# Patient Record
Sex: Female | Born: 1975 | Race: Black or African American | Hispanic: No | Marital: Married | Smoking: Never smoker
Health system: Southern US, Community
[De-identification: ages and names within clinical notes are randomized; demographics above are authoritative.]

## PROBLEM LIST (undated history)

## (undated) DIAGNOSIS — K219 Gastro-esophageal reflux disease without esophagitis: Secondary | ICD-10-CM

## (undated) DIAGNOSIS — F419 Anxiety disorder, unspecified: Secondary | ICD-10-CM

## (undated) HISTORY — PX: WISDOM TOOTH EXTRACTION: SHX21

---

## 2001-10-19 ENCOUNTER — Emergency Department (HOSPITAL_COMMUNITY): Admission: EM | Admit: 2001-10-19 | Discharge: 2001-10-19 | Payer: Self-pay | Admitting: Emergency Medicine

## 2001-10-21 ENCOUNTER — Emergency Department (HOSPITAL_COMMUNITY): Admission: EM | Admit: 2001-10-21 | Discharge: 2001-10-21 | Payer: Self-pay

## 2001-11-25 ENCOUNTER — Emergency Department (HOSPITAL_COMMUNITY): Admission: EM | Admit: 2001-11-25 | Discharge: 2001-11-25 | Payer: Self-pay | Admitting: Emergency Medicine

## 2002-07-24 ENCOUNTER — Emergency Department (HOSPITAL_COMMUNITY): Admission: EM | Admit: 2002-07-24 | Discharge: 2002-07-24 | Payer: Self-pay | Admitting: Emergency Medicine

## 2002-09-25 ENCOUNTER — Emergency Department (HOSPITAL_COMMUNITY): Admission: EM | Admit: 2002-09-25 | Discharge: 2002-09-25 | Payer: Self-pay | Admitting: Emergency Medicine

## 2005-05-10 HISTORY — PX: CHOLECYSTECTOMY: SHX55

## 2011-09-14 ENCOUNTER — Encounter (HOSPITAL_COMMUNITY): Payer: Self-pay

## 2011-09-14 ENCOUNTER — Emergency Department (INDEPENDENT_AMBULATORY_CARE_PROVIDER_SITE_OTHER)
Admission: EM | Admit: 2011-09-14 | Discharge: 2011-09-14 | Disposition: A | Discharge status: Home / Self Care | Payer: Self-pay | Source: Home / Self Care | Attending: Family Medicine | Admitting: Family Medicine

## 2011-09-14 DIAGNOSIS — M545 Low back pain, unspecified: Secondary | ICD-10-CM

## 2011-09-14 MED ORDER — CYCLOBENZAPRINE HCL 10 MG PO TABS: 10.0000 mg | ORAL_TABLET | Freq: Three times a day (TID) | ORAL | Status: AC | PRN

## 2011-09-14 MED ORDER — IBUPROFEN 800 MG PO TABS: 800.0000 mg | ORAL_TABLET | Freq: Three times a day (TID) | ORAL | Status: AC | PRN

## 2011-09-14 NOTE — Discharge Instructions (Signed)
Read the information below.  Use ice/cold compresses, the prescribed pain medication, gentle stretching and massage for pain.  If you need additional pain medication, you may take over the counter tylenol.  If you develop weakness or numbness in your legs, loss of control or bowel or bladder, or are unable to walk, go directly to the nearest emergency room.  You may return to the urgent care at any time for worsening condition or any new symptoms that concern you.  Back Pain, Adult Low back pain is very common. About 1 in 5 people have back pain.The cause of low back pain is rarely dangerous. The pain often gets better over time.About half of people with a sudden onset of back pain feel better in just 2 weeks. About 8 in 10 people feel better by 6 weeks.  CAUSES Some common causes of back pain include:  Strain of the muscles or ligaments supporting the spine.   Wear and tear (degeneration) of the spinal discs.   Arthritis.   Direct injury to the back.  DIAGNOSIS Most of the time, the direct cause of low back pain is not known.However, back pain can be treated effectively even when the exact cause of the pain is unknown.Answering your caregiver's questions about your overall health and symptoms is one of the most accurate ways to make sure the cause of your pain is not dangerous. If your caregiver needs more information, he or she may order lab work or imaging tests (X-rays or MRIs).However, even if imaging tests show changes in your back, this usually does not require surgery. HOME CARE INSTRUCTIONS For many people, back pain returns.Since low back pain is rarely dangerous, it is often a condition that people can learn to [facility] their own.   Remain active. It is stressful on the back to sit or stand in one place. Do not sit, drive, or stand in one place for more than 30 minutes at a time. Take short walks on level surfaces as soon as pain allows.Try to increase the length of time you  walk each day.   Do not stay in bed.Resting more than 1 or 2 days can delay your recovery.   Do not avoid exercise or work.Your body is made to move.It is not dangerous to be active, even though your back may hurt.Your back will likely heal faster if you return to being active before your pain is gone.   Pay attention to your body when you bend and lift. Many people have less discomfortwhen lifting if they bend their knees, keep the load close to their bodies,and avoid twisting. Often, the most comfortable positions are those that put less stress on your recovering back.   Find a comfortable position to sleep. Use a firm mattress and lie on your side with your knees slightly bent. If you lie on your back, put a pillow under your knees.   Only take over-the-counter or prescription medicines as directed by your caregiver. Over-the-counter medicines to reduce pain and inflammation are often the most helpful.Your caregiver may prescribe muscle relaxant drugs.These medicines help dull your pain so you can more quickly return to your normal activities and healthy exercise.   Put ice on the injured area.   Put ice in a plastic bag.   Place a towel between your skin and the bag.   Leave the ice on for 15 to 20 minutes, 3 to 4 times a day for the first 2 to 3 days. After that, ice and  heat may be alternated to reduce pain and spasms.   Ask your caregiver about trying back exercises and gentle massage. This may be of some benefit.   Avoid feeling anxious or stressed.Stress increases muscle tension and can worsen back pain.It is important to recognize when you are anxious or stressed and learn ways to manage it.Exercise is a great option.  SEEK MEDICAL CARE IF:  You have pain that is not relieved with rest or medicine.   You have pain that does not improve in 1 week.   You have new symptoms.   You are generally not feeling well.  SEEK IMMEDIATE MEDICAL CARE IF:   You have pain that  radiates from your back into your legs.   You develop new bowel or bladder control problems.   You have unusual weakness or numbness in your arms or legs.   You develop nausea or vomiting.   You develop abdominal pain.   You feel faint.  Document Released: 04/26/2005 Document Revised: 04/15/2011 Document Reviewed: 09/14/2010 [facility] Patient Information 2012 [place], [place].  Back Exercises Back exercises help treat and prevent back injuries. The goal of back exercises is to increase the strength of your abdominal and back muscles and the flexibility of your back. These exercises should be started when you no longer have back pain. Back exercises include:  Pelvic Tilt. Lie on your back with your knees bent. Tilt your pelvis until the lower part of your back is against the floor. Hold this position 5 to 10 sec and repeat 5 to 10 times.   Knee to Chest. Pull first 1 knee up against your chest and hold for 20 to 30 seconds, repeat this with the other knee, and then both knees. This may be done with the other leg straight or bent, whichever feels better.   Sit-Ups or Curl-Ups. Bend your knees 90 degrees. Start with tilting your pelvis, and do a partial, slow sit-up, lifting your trunk only 30 to 45 degrees off the floor. Take at least 2 to 3 seconds for each sit-up. Do not do sit-ups with your knees out straight. If partial sit-ups are difficult, simply do the above but with only tightening your abdominal muscles and holding it as directed.   Hip-Lift. Lie on your back with your knees flexed 90 degrees. Push down with your feet and shoulders as you raise your hips a couple inches off the floor; hold for 10 seconds, repeat 5 to 10 times.   Back arches. Lie on your stomach, propping yourself up on bent elbows. Slowly press on your hands, causing an arch in your low back. Repeat 3 to 5 times. Any initial stiffness and discomfort should lessen with repetition over time.   Shoulder-Lifts. Lie face  down with arms beside your body. Keep hips and torso pressed to floor as you slowly lift your head and shoulders off the floor.  Do not overdo your exercises, especially in the beginning. Exercises may cause you some mild back discomfort which lasts for a few minutes; however, if the pain is more severe, or lasts for more than 15 minutes, do not continue exercises until you see your caregiver. Improvement with exercise therapy for back problems is slow.  See your caregivers for assistance with developing a proper back exercise program. Document Released: 06/03/2004 Document Revised: 04/15/2011 Document Reviewed: 04/26/2005 [facility] Patient Information 2012 [place], [place].  If you have no primary doctor, here are some resources that may be helpful:  Medicaid-accepting [facility] Providers:   -  [facility]- [address]      [phone]      Mon-Fri 9am-7pm, Sat 9am-1pm   - [facility]- [address] [place]      [phone]   - [facility]- [address] [place]      [phone]   [facility]- [address]      [phone]   - Renaye Rakers- [address] [place], [address]      [phone]      Only accepts [place] patients       after they have her name applied to their card   Self Pay (no insurance) in [place]:   - Sickle Cell Patients: Dr Willey Blade, [facility]      [address] [place]      [phone]   - [facility] [phone]   - Physician Referral Service- [phone]   - [facility]- [address] [place]      [phone]   [facility]- [address]   - [facility]- see information above      (Speak to [employer] if you do not have insurance)   - [facility]- [address] [place]      [phone]   - [facility] [place]- [address]      [phone]   - [facility]-  [address]      [phone]   - Dr Julio Sicks-  [address], [place]      [phone]   - [facility]- [address]      [phone]   - [facility]- [address]      [phone]      Also [address]      [phone]   - [facility]- [address]      [phone]      1st and 3rd Saturday every month, 10am-1pm Other agencies that provide inexpensive medical care:    [facility]  [phone]    [facility]  [phone]    [facility]  [phone]    [facility]  [phone]    [facility]  [phone]  General Information: Finding a doctor when you do not have health insurance can be tricky. Although you are not limited by an insurance plan, you are of course limited by her finances and how much but he can pay out of pocket.  What are your options if you don't have health insurance?   1) Find a Librarian, academic and Pay Out of Pocket Although you won't have to find out who is covered by your insurance plan, it is a good idea to ask around and get recommendations. You will then need to call the office and see if the doctor you have chosen will accept you as a new patient and what types of options they offer for patients who are self-pay. Some doctors offer discounts or will set up payment plans for their patients who do not have insurance, but you will need to ask so you aren't surprised when you get to your appointment.  2) Contact Your Local Health Department Not all health departments have doctors that can see patients for sick visits, but many  do, so it is worth a call to see if yours does. If you don't know where your local health department is, you can check in your phone book. The CDC also has a tool to help you locate your state's health department, and many state websites also have listings of all of their local health departments.  3) Find a Walk-in Clinic If your illness is not  likely to be very severe or complicated, you may want to try a walk in clinic. These are popping up all over the country in pharmacies, drugstores, and shopping centers. They're usually staffed by nurse practitioners or physician assistants that have been trained to treat common illnesses and complaints. They're usually fairly quick and inexpensive. However, if you have serious medical issues or chronic medical problems, these are probably not your best option  RESOURCE GUIDE  Dental Problems  Patients with Medicaid: [facility] [phone] [address]                                           [phone] [employer] [address] Phone:  [phone]                                                  Phone:  [phone]  If unable to pay or uninsured, contact:  [facility] or [facility]. to become qualified for the adult dental clinic.  Chronic Pain Problems Contact [facility]  [phone] Patients need to be referred by their primary care doctor.  Insufficient Money for Medicine Contact [employer] Way:  call "211" or [facility] [phone].  No Primary Care Doctor Call [facility]  [phone] Other agencies that provide inexpensive medical care    [facility]  [phone]    [facility]  [phone]    [facility]  [phone]    [facility]  [phone]    [facility]  [phone]    [facility]  [phone]  Psychological Services [facility] Behavior[facility] Health  [phone] [facility] Services  [phone] [facility] Mental Health   [phone] (emergency services [phone])  Substance Abuse Resources [facility] and Drug Services  [phone] [facility] [phone] [facility] [place] [phone] Floydene Flock [phone] Residential & Outpatient Substance Abuse Program  563-676-0017  Abuse/Neglect [facility] Child Abuse Hotline 6265985733 [facility] Child Abuse Hotline 218-124-7232 (After Hours)  Emergency Shelter [facility] [phone]  Maternity Homes Room at the [place] [facility] [place] [phone] [facility] [phone]  MRSA Hotline #:   587-793-2081    [facility] Resources  [facility]     [employer]                          [facility] Dept. [address] [place]                       [address]      [address]  [address]  Sela Hua Phone:  [phone]                                   Phone:  [phone]                 Phone:  [place] Phone:  [place] [phone] (After Hours)

## 2011-09-14 NOTE — ED Provider Notes (Signed)
History     CSN: [id number]  Arrival date & time 09/14/11  4696   First MD Initiated Contact with Patient 09/14/11 [phone]      Chief Complaint  Patient presents with  . Back Pain    (Consider location/radiation/quality/duration/timing/severity/associated sxs/prior treatment) HPI Comments: Patient reports she was standing on something trying to pull a suitcase out of her closet when she slipped an fell against the door frame, injuring her back.  Reports pain in her left lower back, aching in nature, constant, worse with moving, twisting, or bending, or with palpation.  Pain does not radiate.  Denies fevers, bladder or bowel incontinence or retention, weakness or numbness of the legs, abdominal pain, urinary, vaginal or bowel symptoms.  Pt is currently menstruating, period is on time and normal.    Patient is a 36 y.o. female presenting with back pain. The history is provided by the patient.  Back Pain  Pertinent negatives include no fever, no numbness, no abdominal pain, no dysuria and no weakness.    History reviewed. No pertinent past medical history.  History reviewed. No pertinent past surgical history.  No family history on file.  History  Substance Use Topics  . Smoking status: Never Smoker   . Smokeless tobacco: Not on file  . Alcohol Use: Yes    OB History    Grav Para Term Preterm Abortions TAB SAB Ect Mult Living                  Review of Systems  Constitutional: Negative for fever and chills.  Gastrointestinal: Negative for vomiting, abdominal pain, diarrhea and constipation.  Genitourinary: Negative for dysuria, urgency, frequency and menstrual problem.  Musculoskeletal: Positive for back pain.  Neurological: Negative for weakness and numbness.    Allergies  Review of patient's allergies indicates no known allergies.  Home Medications  No current outpatient prescriptions on file.  BP 137/67  Pulse 78  Temp(Src) 98.1 F (36.7 C) (Oral)  Resp 18   SpO2 100%  LMP 09/10/2011  Physical Exam  Nursing note and vitals reviewed. Constitutional: She is oriented to person, place, and time. She appears well-developed and well-nourished.  HENT:  Head: Normocephalic and atraumatic.  Neck: Normal range of motion. Neck supple.  Cardiovascular: Normal rate and regular rhythm.   Pulmonary/Chest: Effort normal and breath sounds normal.  Abdominal: Soft. She exhibits no distension and no mass. There is no tenderness. There is no rebound and no guarding.  Musculoskeletal: Normal range of motion. She exhibits no edema.       Cervical back: Normal.       Thoracic back: Normal.       Lumbar back: Normal.       Arms:      Lower extremities: strength 5/5, sensation intact, distal pulses intact.  Straight leg raise negative.    Neurological: She is alert and oriented to person, place, and time. She has normal strength. No sensory deficit.    ED Course  Procedures (including critical care time)  Labs Reviewed - No data to display No results found.   1. Low back pain       MDM  Nontoxic afebrile patient with left sided low back pain after slipping off stool while reaching for something in a closet. She did not fall to ground or hit her head.  Pain is within soft tissues, likely muscle strain.  No bony tenderness or neurological deficits.  Pt d/c home with flexeril and ibuprofen, return precautions given.  Patient verbalizes understanding and agrees with plan.          Rise Patience, [place] 09/14/11 1252

## 2011-09-14 NOTE — ED Notes (Signed)
Pt states she injured her back trying to remove a suitcase from the top of her closet yesterday.  C/o pain in lt lower back.

## 2011-09-15 NOTE — ED Provider Notes (Signed)
Medical screening examination/treatment/procedure(s) were performed by resident physician or non-physician practitioner and as supervising physician I was immediately available for consultation/collaboration.   Lailana Shira DOUGLAS MD.    Ilario Dhaliwal D Daisha Filosa, MD 09/15/11 2058 

## 2013-01-15 ENCOUNTER — Encounter (HOSPITAL_COMMUNITY): Payer: Self-pay | Admitting: Emergency Medicine

## 2013-01-15 ENCOUNTER — Emergency Department (INDEPENDENT_AMBULATORY_CARE_PROVIDER_SITE_OTHER)
Admission: EM | Admit: 2013-01-15 | Discharge: 2013-01-15 | Disposition: A | Discharge status: Home / Self Care | Payer: PRIVATE HEALTH INSURANCE | Source: Home / Self Care | Attending: Family Medicine | Admitting: Family Medicine

## 2013-01-15 LAB — POCT PREGNANCY, URINE: Preg Test, Ur: NEGATIVE

## 2013-01-15 MED ORDER — METHOCARBAMOL 500 MG PO TABS: 500.0000 mg | ORAL_TABLET | Freq: Three times a day (TID) | ORAL | Status: DC
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
## 2014-07-16 ENCOUNTER — Telehealth: Payer: Self-pay

## 2014-07-16 DIAGNOSIS — Z029 Encounter for administrative examinations, unspecified: Secondary | ICD-10-CM

## 2014-07-16 NOTE — Telephone Encounter (Signed)
fmla papers ready to pick up 15.00 charge

## 2014-10-01 LAB — HM PAP SMEAR: HM Pap smear: NORMAL

## 2014-11-05 LAB — HM MAMMOGRAPHY: HM Mammogram: NORMAL

## 2015-04-09 ENCOUNTER — Emergency Department (HOSPITAL_COMMUNITY)
Admission: EM | Admit: 2015-04-09 | Discharge: 2015-04-09 | Disposition: A | Discharge status: Home / Self Care | Payer: BC Managed Care – PPO | Source: Home / Self Care

## 2015-04-09 ENCOUNTER — Encounter (HOSPITAL_COMMUNITY): Payer: Self-pay | Admitting: Emergency Medicine

## 2015-04-09 DIAGNOSIS — R1011 Right upper quadrant pain: Secondary | ICD-10-CM

## 2015-04-09 HISTORY — DX: Anxiety disorder, unspecified: F41.9

## 2015-04-09 LAB — POCT URINALYSIS DIP (DEVICE)
Glucose, UA: 100 mg/dL — AB
Hgb urine dipstick: NEGATIVE
Leukocytes, UA: NEGATIVE
Nitrite: NEGATIVE
Protein, ur: 30 mg/dL — AB
Specific Gravity, Urine: >=1.03 (ref 1.005–1.030)
Urobilinogen, UA: 4 mg/dL — ABNORMAL HIGH (ref 0.0–1.0)
pH: 6 (ref 5.0–8.0)

## 2015-04-09 NOTE — ED Provider Notes (Signed)
CSN: [id number]     Arrival date & time 04/09/15  1302 History   None    Chief Complaint  Patient presents with  . Back Pain   (Consider location/radiation/quality/duration/timing/severity/associated sxs/prior Treatment) HPI  abdomen Several days pain has been present, episodic. No treatment at home. No nausea, vomiting. Sharp on occasion, radiates at times to back.  Past Medical History  Diagnosis Date  . Anxiety    Past Surgical History  Procedure Laterality Date  . Cholecystectomy     History reviewed. No pertinent family history. Social History  Substance Use Topics  . Smoking status: Never Smoker   . Smokeless tobacco: None  . Alcohol Use: Yes   OB History    No data available     Review of Systems ROS +'ve  ABDOMINAL PAIN  Denies: HEADACHE, NAUSEA,, CHEST PAIN, CONGESTION, DYSURIA, SHORTNESS OF BREATH   Allergies  Review of patient's allergies indicates no known allergies.  Home Medications   Prior to Admission medications   Medication Sig Start Date End Date Taking? Authorizing Provider  OVER THE COUNTER MEDICATION Prescribed  Anti anxiety medicine-cannot remember the name   Yes Historical Provider, MD  OVER THE COUNTER MEDICATION Over the counter diet pills started a month ago and stopped about a week ago, (after onset of right side pain)   Yes Historical Provider, MD  methocarbamol (ROBAXIN) 500 MG tablet Take 1 tablet (500 mg total) by mouth 3 (three) times daily. As muscle relaxer Patient not taking: Reported on 04/09/2015 01/15/13   Linna Hoff, MD   Meds Ordered and Administered this Visit  Medications - No data to display  BP 127/80 mmHg  Pulse 83  Temp(Src) 97.8 F (36.6 C) (Oral)  Resp 18  SpO2 99%  LMP 03/17/2015 No data found.   Physical Exam  Constitutional: She is oriented to person, place, and time. She appears well-developed and well-nourished.  HENT:  Head: Normocephalic and atraumatic.  Mouth/Throat: Oropharynx is clear and  moist.  Eyes: Conjunctivae are normal.  Neck: Normal range of motion. Neck supple.  Cardiovascular: Normal rate.   Pulmonary/Chest: Effort normal and breath sounds normal.  Abdominal: Soft. Bowel sounds are normal. She exhibits no distension. There is no tenderness. There is no rebound and no guarding.  Musculoskeletal: Normal range of motion.  Neurological: She is alert and oriented to person, place, and time.  Skin: Skin is warm and dry.  Psychiatric: She has a normal mood and affect. Her behavior is normal. Judgment and thought content normal.  Nursing note and vitals reviewed.   ED Course  Procedures (including critical care time)  Labs Review Labs Reviewed  POCT URINALYSIS DIP (DEVICE) - Abnormal; Notable for the following:    Glucose, UA 100 (*)    Bilirubin Urine LARGE (*)    Ketones, ur TRACE (*)    Protein, ur 30 (*)    Urobilinogen, UA 4.0 (*)    All other components within normal limits    Imaging Review No results found.   Visual Acuity Review  Right Eye Distance:   Left Eye Distance:   Bilateral Distance:    Right Eye Near:   Left Eye Near:    Bilateral Near:         MDM   1. Right upper quadrant pain    I have discussed urine findings with patient and the need for her to follow up with a PCP to further investigate the changes in her urine. She has had a  cholecystectomy in 2007.    Tharon AquasFrank C Lira Stephen, PA 04/09/15 2045

## 2015-04-09 NOTE — Discharge Instructions (Signed)
Results for Hardie PulleyFISHER, Carmellia (MRN [id number]) as of 04/09/2015 14:55  Ref. Range 04/09/2015 14:16  Bilirubin Urine Latest Ref Range: NEGATIVE  LARGE (A)  Glucose Latest Ref Range: NEGATIVE mg/dL 409100 (A)  Hgb urine dipstick Latest Ref Range: NEGATIVE  NEGATIVE  Ketones, ur Latest Ref Range: NEGATIVE mg/dL TRACE (A)  Leukocytes, UA Latest Ref Range: NEGATIVE  NEGATIVE  Nitrite Latest Ref Range: NEGATIVE  NEGATIVE  pH Latest Ref Range: 5.0-8.0  6.0  Protein Latest Ref Range: NEGATIVE mg/dL 30 (A)  Specific Gravity, Urine Latest Ref Range: 1.005-1.030  >=1.030  Urobilinogen, UA Latest Ref Range: 0.0-1.0 mg/dL 4.0 (H)    You will need to follow up with PCP in the near future to have your urine test evaluated more thoroughly.  There are no untoward findings that require emergent follow up at this time.  Eat your normal diet, limit fatty foods, drink plenty of water

## 2015-04-21 ENCOUNTER — Ambulatory Visit: Payer: Self-pay | Admitting: Internal Medicine

## 2015-04-24 ENCOUNTER — Ambulatory Visit (INDEPENDENT_AMBULATORY_CARE_PROVIDER_SITE_OTHER): Payer: BC Managed Care – PPO | Admitting: Internal Medicine

## 2015-04-24 ENCOUNTER — Encounter (INDEPENDENT_AMBULATORY_CARE_PROVIDER_SITE_OTHER): Payer: Self-pay

## 2015-04-24 ENCOUNTER — Encounter: Payer: Self-pay | Admitting: Internal Medicine

## 2015-04-24 VITALS — BP 138/80 | HR 69 | Temp 98.0°F | Ht 62.0 in | Wt 226.0 lb

## 2015-04-24 DIAGNOSIS — R1011 Right upper quadrant pain: Secondary | ICD-10-CM | POA: Diagnosis not present

## 2015-04-24 DIAGNOSIS — F411 Generalized anxiety disorder: Secondary | ICD-10-CM | POA: Insufficient documentation

## 2015-04-24 NOTE — Patient Instructions (Signed)

## 2015-04-24 NOTE — Progress Notes (Signed)
HPI  Pt presents to the clinic today to establish care and for management of the conditions listed below. She has not had a PCP in many years but she has been following with GYN.  Anxiety: She is prescribed Celexa but reports she doesn't take it. She does not feel like she needs it right now. When she did take it, she recalls that it made her sleepy.  She also reports RUQ pain. This started 2 weeks ago. The pain starts after eating fatty meals. ometimes it is associated with nausea but no vomiting. Her bowels are moving normally. She did try Mylanta but reports that made her vomit. She did have her gallbladder removed in 2007. She denies urinary or vaginal complaints.  Flu: 02/2014, declines today Tetanus 2014 Pap Smear: 11/2014 Mammogram: 10/2014, family history Dentist: as needed  Past Medical History  Diagnosis Date  . Anxiety     Current Outpatient Prescriptions  Medication Sig Dispense Refill  . citalopram (CELEXA) 10 MG tablet Take 10 mg by mouth daily. Reported on 04/24/2015     No current facility-administered medications for this visit.    No Known Allergies  Family History  Problem Relation Age of Onset  . Hypertension Mother   . Arthritis Maternal Grandmother   . Breast cancer Maternal Grandmother     Social History   Social History  . Marital Status: Married    Spouse Name: N/A  . Number of Children: N/A  . Years of Education: N/A   Occupational History  . Not on file.   Social History Main Topics  . Smoking status: Never Smoker   . Smokeless tobacco: Never Used  . Alcohol Use: 0.0 oz/week    0 Standard drinks or equivalent per week     Comment: occasional  . Drug Use: No  . Sexual Activity: Yes    Birth Control/ Protection: None   Other Topics Concern  . Not on file   Social History Narrative    ROS:  Constitutional: Denies fever, malaise, fatigue, headache or abrupt weight changes.  Respiratory: Denies difficulty breathing, shortness of  breath, cough or sputum production.   Cardiovascular: Denies chest pain, chest tightness, palpitations or swelling in the hands or feet.  Gastrointestinal: Pt reports RUQ pain and nausea. Denies bloating, constipation, diarrhea or blood in the stool.  GU: Denies frequency, urgency, pain with urination, blood in urine, odor or discharge. Musculoskeletal: Denies decrease in range of motion, difficulty with gait, muscle pain or joint pain and swelling.  Skin: Denies redness, rashes, lesions or ulcercations.  Neurological: Denies dizziness, difficulty with memory, difficulty with speech or problems with balance and coordination.  Psych: Denies anxiety, depression, SI/HI.  No other specific complaints in a complete review of systems (except as listed in HPI above).  PE:  BP 138/80 mmHg  Pulse 69  Temp(Src) 98 F (36.7 C) (Oral)  Ht  (1.575 m)  Wt 226 lb (102.513 kg)  BMI 41.33 kg/m2  SpO2 98%  LMP 04/13/2015 Wt Readings from Last 3 Encounters:  04/24/15 226 lb (102.513 kg)    General: Appears her stated age, obese in NAD. Cardiovascular: Normal rate and rhythm. S1,S2 noted.  No murmur, rubs or gallops noted. Pulmonary/Chest: Normal effort and positive vesicular breath sounds. No respiratory distress. No wheezes, rales or ronchi noted.  Abdomen: Soft and nontender. Normal bowel sounds. Neurological: Alert and oriented. Psychiatric: Mood and affect normal. Behavior is normal. Judgment and thought content normal.    Assessment and Plan:  RUQ pain:  S/p cholecystectomy CMET today Try Tums prn to see if that helps  Make an appt for your annual exam

## 2015-04-24 NOTE — Assessment & Plan Note (Signed)
Stable at this time off meds Will continue to monitor

## 2015-04-25 LAB — COMPREHENSIVE METABOLIC PANEL
ALT: 31 U/L (ref 0–35)
AST: 26 U/L (ref 0–37)
Albumin: 4.2 g/dL (ref 3.5–5.2)
Alkaline Phosphatase: 105 U/L (ref 39–117)
BUN: 11 mg/dL (ref 6–23)
CO2: 29 mEq/L (ref 19–32)
Calcium: 9.9 mg/dL (ref 8.4–10.5)
Chloride: 100 mEq/L (ref 96–112)
Creatinine, Ser: 0.75 mg/dL (ref 0.40–1.20)
GFR: 111.03 mL/min (ref >60.00)
Glucose, Bld: 85 mg/dL (ref 70–99)
Potassium: 3.9 mEq/L (ref 3.5–5.1)
Sodium: 137 mEq/L (ref 135–145)
Total Bilirubin: 0.7 mg/dL (ref 0.2–1.2)
Total Protein: 7.7 g/dL (ref 6.0–8.3)

## 2015-05-07 ENCOUNTER — Encounter: Payer: Self-pay | Admitting: Internal Medicine

## 2015-05-16 ENCOUNTER — Encounter: Payer: Self-pay | Admitting: Primary Care

## 2015-05-16 ENCOUNTER — Ambulatory Visit (INDEPENDENT_AMBULATORY_CARE_PROVIDER_SITE_OTHER): Payer: BC Managed Care – PPO | Admitting: Primary Care

## 2015-05-16 VITALS — BP 126/74 | HR 93 | Temp 98.0°F | Ht 62.0 in | Wt 219.8 lb

## 2015-05-16 DIAGNOSIS — R1011 Right upper quadrant pain: Secondary | ICD-10-CM | POA: Diagnosis not present

## 2015-05-16 NOTE — Progress Notes (Signed)
Pre visit review using our clinic review tool, if applicable. No additional management support is needed unless otherwise documented below in the visit note. 

## 2015-05-16 NOTE — Progress Notes (Signed)
   Subjective:    Patient ID: Brianna Schneider, female    DOB: 08-04-1975, 40 y.o.   MRN: [id number]  HPI  Ms. Brianna Schneider is a 40 year old female who presents today with a chief complaint of  abdominal pain. Her pain is located to the RUQ, epigastric, and right flank region. Her pain has been present intermittently for the past several months. Her last episode was Wednesday night this week. She describes her pain as dull, achy, and is tender upon palpation. Her gallbladder was removed in 2007. She's had 2 episodes of nausea with vomiting Wednesday. She also reports frequent belching.  She was evaluated several weeks ago regarding the same. She's taken advil and Tums with temporary improvement. She does experience some burning to her esophagus. Denies fevers, diarrhea. Her bowel movements are once daily on average, last BM was today. She She's been able to eat and drink bland foods today.   Review of Systems  Constitutional: Negative for fever and chills.  Gastrointestinal: Positive for nausea, vomiting and abdominal pain. Negative for diarrhea and constipation.       Past Medical History  Diagnosis Date  . Anxiety     Social History   Social History  . Marital Status: Married    Spouse Name: N/A  . Number of Children: N/A  . Years of Education: N/A   Occupational History  . Not on file.   Social History Main Topics  . Smoking status: Never Smoker   . Smokeless tobacco: Never Used  . Alcohol Use: 0.0 oz/week    0 Standard drinks or equivalent per week     Comment: occasional  . Drug Use: No  . Sexual Activity: Yes    Birth Control/ Protection: None   Other Topics Concern  . Not on file   Social History Narrative    Past Surgical History  Procedure Laterality Date  . Cholecystectomy  2007  . Wisdom tooth extraction      Family History  Problem Relation Age of Onset  . Hypertension Mother   . Arthritis Maternal Grandmother   . Breast cancer Maternal Grandmother      No Known Allergies  Current Outpatient Prescriptions on File Prior to Visit  Medication Sig Dispense Refill  . citalopram (CELEXA) 10 MG tablet Take 10 mg by mouth daily. Reported on 05/16/2015     No current facility-administered medications on file prior to visit.    BP 126/74 mmHg  Pulse 93  Temp(Src) 98 F (36.7 C) (Oral)  Ht 5\' 2"  (1.575 m)  Wt 219 lb 12.8 oz (99.701 kg)  BMI 40.19 kg/m2  SpO2 99%  LMP 04/16/2015    Objective:   Physical Exam  Constitutional: She appears well-nourished.  Cardiovascular: Normal rate and regular rhythm.   Pulmonary/Chest: Effort normal and breath sounds normal.  Abdominal: Soft. Normal appearance and bowel sounds are normal. There is tenderness in the right upper quadrant.  Skin: Skin is warm and dry.          Assessment & Plan:  Abdominal pain:  Located to RUQ, epigastric region, radiates to right flank. Burning, sharp pain with belching. Some n/v Wednesday, none since. Tolerating oral intake today. Suspect GERD and will do 4 week trial of omeprazole. Offered US of RUQ, patient would like to try omeprazole first. CMP reviewed from last visit and is WNL. She is to notify us if no improvement on omeprazole or if pain persists. Return precautions provided.

## 2015-05-16 NOTE — Patient Instructions (Signed)
Start omeprazole (Prilosec) tablets for acid reflux. Take 1 tablet by mouth everyday.  Work to reduce fried/fatty foods. Take a look at the triggers of acid reflux below.  Please call us if you continue to experience pain as we may need to do an ultrasound of your abdomen.   It was a pleasure meeting you!  Food Choices for Gastroesophageal Reflux Disease, Adult When you have gastroesophageal reflux disease (GERD), the foods you eat and your eating habits are very important. Choosing the right foods can help ease the discomfort of GERD. WHAT GENERAL GUIDELINES DO I NEED TO FOLLOW?  Choose fruits, vegetables, whole grains, low-fat dairy products, and low-fat meat, fish, and poultry.  Limit fats such as oils, salad dressings, butter, nuts, and avocado.  Keep a food diary to identify foods that cause symptoms.  Avoid foods that cause reflux. These may be different for different people.  Eat frequent small meals instead of three large meals each day.  Eat your meals slowly, in a relaxed setting.  Limit fried foods.  Cook foods using methods other than frying.  Avoid drinking alcohol.  Avoid drinking large amounts of liquids with your meals.  Avoid bending over or lying down until 2-3 hours after eating. WHAT FOODS ARE NOT RECOMMENDED? The following are some foods and drinks that may worsen your symptoms: Vegetables Tomatoes. Tomato juice. Tomato and spaghetti sauce. Chili peppers. Onion and garlic. Horseradish. Fruits Oranges, grapefruit, and lemon (fruit and juice). Meats High-fat meats, fish, and poultry. This includes hot dogs, ribs, ham, sausage, salami, and bacon. Dairy Whole milk and chocolate milk. Sour cream. Cream. Butter. Ice cream. Cream cheese.  Beverages Coffee and tea, with or without caffeine. Carbonated beverages or energy drinks. Condiments Hot sauce. Barbecue sauce.  Sweets/Desserts Chocolate and cocoa. Donuts. Peppermint and spearmint. Fats and  Oils High-fat foods, including [place] fries and potato chips. Other Vinegar. Strong spices, such as black pepper, white pepper, red pepper, cayenne, curry powder, cloves, ginger, and chili powder. The items listed above may not be a complete list of foods and beverages to avoid. Contact your dietitian for more information.   This information is not intended to replace advice given to you by your health care provider. Make sure you discuss any questions you have with your health care provider.   Document Released: 04/26/2005 Document Revised: 05/17/2014 Document Reviewed: 02/28/2013 Elsevier Interactive Patient Education [employer]2016 Elsevier Inc.

## 2015-05-19 ENCOUNTER — Ambulatory Visit: Payer: Self-pay | Admitting: Internal Medicine

## 2015-05-22 ENCOUNTER — Encounter: Payer: Self-pay | Admitting: Internal Medicine

## 2015-05-25 ENCOUNTER — Inpatient Hospital Stay (HOSPITAL_COMMUNITY)
Admission: EM | Admit: 2015-05-25 | Discharge: 2015-05-27 | DRG: 445 | Disposition: A | Discharge status: Home / Self Care | Payer: BC Managed Care – PPO | Attending: Internal Medicine | Admitting: Internal Medicine

## 2015-05-25 ENCOUNTER — Encounter (HOSPITAL_COMMUNITY): Payer: Self-pay | Admitting: *Deleted

## 2015-05-25 ENCOUNTER — Emergency Department (HOSPITAL_COMMUNITY): Payer: BC Managed Care – PPO

## 2015-05-25 DIAGNOSIS — Z803 Family history of malignant neoplasm of breast: Secondary | ICD-10-CM

## 2015-05-25 DIAGNOSIS — R1011 Right upper quadrant pain: Secondary | ICD-10-CM | POA: Diagnosis present

## 2015-05-25 DIAGNOSIS — R109 Unspecified abdominal pain: Secondary | ICD-10-CM | POA: Diagnosis present

## 2015-05-25 DIAGNOSIS — Z6841 Body Mass Index (BMI) 40.0 and over, adult: Secondary | ICD-10-CM | POA: Diagnosis not present

## 2015-05-25 DIAGNOSIS — R7401 Elevation of levels of liver transaminase levels: Secondary | ICD-10-CM | POA: Diagnosis present

## 2015-05-25 DIAGNOSIS — Z8249 Family history of ischemic heart disease and other diseases of the circulatory system: Secondary | ICD-10-CM | POA: Diagnosis not present

## 2015-05-25 DIAGNOSIS — K219 Gastro-esophageal reflux disease without esophagitis: Secondary | ICD-10-CM | POA: Insufficient documentation

## 2015-05-25 DIAGNOSIS — R7989 Other specified abnormal findings of blood chemistry: Secondary | ICD-10-CM | POA: Diagnosis present

## 2015-05-25 DIAGNOSIS — F419 Anxiety disorder, unspecified: Secondary | ICD-10-CM | POA: Diagnosis present

## 2015-05-25 DIAGNOSIS — R740 Nonspecific elevation of levels of transaminase and lactic acid dehydrogenase [LDH]: Secondary | ICD-10-CM

## 2015-05-25 DIAGNOSIS — K831 Obstruction of bile duct: Secondary | ICD-10-CM | POA: Diagnosis not present

## 2015-05-25 DIAGNOSIS — K8051 Calculus of bile duct without cholangitis or cholecystitis with obstruction: Secondary | ICD-10-CM | POA: Diagnosis present

## 2015-05-25 DIAGNOSIS — Q445 Other congenital malformations of bile ducts: Secondary | ICD-10-CM

## 2015-05-25 DIAGNOSIS — K838 Other specified diseases of biliary tract: Secondary | ICD-10-CM | POA: Diagnosis not present

## 2015-05-25 DIAGNOSIS — R945 Abnormal results of liver function studies: Secondary | ICD-10-CM

## 2015-05-25 DIAGNOSIS — R748 Abnormal levels of other serum enzymes: Secondary | ICD-10-CM

## 2015-05-25 DIAGNOSIS — Z9049 Acquired absence of other specified parts of digestive tract: Secondary | ICD-10-CM | POA: Diagnosis not present

## 2015-05-25 HISTORY — DX: Gastro-esophageal reflux disease without esophagitis: K21.9

## 2015-05-25 LAB — COMPREHENSIVE METABOLIC PANEL
ALT: 516 U/L — ABNORMAL HIGH (ref 14–54)
AST: 409 U/L — ABNORMAL HIGH (ref 15–41)
Albumin: 4.1 g/dL (ref 3.5–5.0)
Alkaline Phosphatase: 475 U/L — ABNORMAL HIGH (ref 38–126)
Anion gap: 12 (ref 5–15)
BUN: <5 mg/dL — ABNORMAL LOW (ref 6–20)
CO2: 25 mmol/L (ref 22–32)
Calcium: 9.8 mg/dL (ref 8.9–10.3)
Chloride: 98 mmol/L — ABNORMAL LOW (ref 101–111)
Creatinine, Ser: 0.69 mg/dL (ref 0.44–1.00)
GFR calc Af Amer: >60 mL/min (ref >60)
GFR calc non Af Amer: >60 mL/min (ref >60)
Glucose, Bld: 147 mg/dL — ABNORMAL HIGH (ref 65–99)
Potassium: 4 mmol/L (ref 3.5–5.1)
Sodium: 135 mmol/L (ref 135–145)
Total Bilirubin: 4.6 mg/dL — ABNORMAL HIGH (ref 0.3–1.2)
Total Protein: 8.1 g/dL (ref 6.5–8.1)

## 2015-05-25 LAB — URINALYSIS, ROUTINE W REFLEX MICROSCOPIC
Glucose, UA: NEGATIVE mg/dL
Hgb urine dipstick: NEGATIVE
Ketones, ur: 15 mg/dL — AB
Leukocytes, UA: NEGATIVE
Nitrite: NEGATIVE
Protein, ur: NEGATIVE mg/dL
Specific Gravity, Urine: 1.018 (ref 1.005–1.030)
pH: 6.5 (ref 5.0–8.0)

## 2015-05-25 LAB — LIPASE, BLOOD: Lipase: 17 U/L (ref 11–51)

## 2015-05-25 LAB — CBC
HCT: 41.5 % (ref 36.0–46.0)
Hemoglobin: 14.4 g/dL (ref 12.0–15.0)
MCH: 31.4 pg (ref 26.0–34.0)
MCHC: 34.7 g/dL (ref 30.0–36.0)
MCV: 90.4 fL (ref 78.0–100.0)
Platelets: 286 10*3/uL (ref 150–400)
RBC: 4.59 MIL/uL (ref 3.87–5.11)
RDW: 13.3 % (ref 11.5–15.5)
WBC: 16.8 10*3/uL — ABNORMAL HIGH (ref 4.0–10.5)

## 2015-05-25 LAB — I-STAT BETA HCG BLOOD, ED (MC, WL, AP ONLY): I-stat hCG, quantitative: <5 m[IU]/mL (ref <5)

## 2015-05-25 MED ORDER — ONDANSETRON HCL 4 MG/2ML IJ SOLN
4.0000 mg | Freq: Once | INTRAMUSCULAR | Status: AC
  Administered 2015-05-25: 4 mg via INTRAVENOUS
  Filled 2015-05-25: qty 2

## 2015-05-25 MED ORDER — SODIUM CHLORIDE 0.9 % IV SOLN
INTRAVENOUS | Status: DC
  Administered 2015-05-25 – 2015-05-26 (×2): via INTRAVENOUS

## 2015-05-25 MED ORDER — IOHEXOL 300 MG/ML  SOLN
100.0000 mL | Freq: Once | INTRAMUSCULAR | Status: AC | PRN
  Administered 2015-05-25: 100 mL via INTRAVENOUS

## 2015-05-25 MED ORDER — SODIUM CHLORIDE 0.9 % IV SOLN
INTRAVENOUS | Status: DC
  Administered 2015-05-26: 06:00:00 via INTRAVENOUS

## 2015-05-25 MED ORDER — SENNOSIDES-DOCUSATE SODIUM 8.6-50 MG PO TABS
1.0000 | ORAL_TABLET | Freq: Every evening | ORAL | Status: DC | PRN
  Administered 2015-05-26: 1 via ORAL
  Filled 2015-05-25: qty 1

## 2015-05-25 MED ORDER — BISACODYL 10 MG RE SUPP: 10.0000 mg | Freq: Every day | RECTAL | Status: DC | PRN

## 2015-05-25 MED ORDER — HYDROMORPHONE HCL 1 MG/ML IJ SOLN
1.0000 mg | Freq: Once | INTRAMUSCULAR | Status: AC
  Administered 2015-05-25: 1 mg via INTRAVENOUS
  Filled 2015-05-25: qty 1

## 2015-05-25 MED ORDER — CIPROFLOXACIN IN D5W 400 MG/200ML IV SOLN
400.0000 mg | INTRAVENOUS | Status: DC
  Administered 2015-05-25 – 2015-05-26 (×2): 400 mg via INTRAVENOUS
  Filled 2015-05-25 (×3): qty 200

## 2015-05-25 MED ORDER — HEPARIN SODIUM (PORCINE) 5000 UNIT/ML IJ SOLN
5000.0000 [IU] | Freq: Three times a day (TID) | INTRAMUSCULAR | Status: DC
  Administered 2015-05-25 – 2015-05-27 (×4): 5000 [IU] via SUBCUTANEOUS
  Filled 2015-05-25 (×4): qty 1

## 2015-05-25 MED ORDER — ONDANSETRON HCL 4 MG PO TABS: 4.0000 mg | ORAL_TABLET | Freq: Four times a day (QID) | ORAL | Status: DC | PRN

## 2015-05-25 MED ORDER — HYDROMORPHONE HCL 1 MG/ML IJ SOLN
1.0000 mg | INTRAMUSCULAR | Status: DC | PRN
Start: 2015-05-25 — End: 2015-05-27
  Administered 2015-05-25: 1 mg via INTRAVENOUS
  Filled 2015-05-25: qty 1

## 2015-05-25 MED ORDER — MORPHINE SULFATE (PF) 4 MG/ML IV SOLN
4.0000 mg | Freq: Once | INTRAVENOUS | Status: AC
  Administered 2015-05-25: 4 mg via INTRAVENOUS
  Filled 2015-05-25: qty 1

## 2015-05-25 MED ORDER — ONDANSETRON HCL 4 MG/2ML IJ SOLN: 4.0000 mg | Freq: Four times a day (QID) | INTRAMUSCULAR | Status: DC | PRN

## 2015-05-25 NOTE — ED Notes (Signed)
Attempted report 

## 2015-05-25 NOTE — ED Notes (Signed)
Pt c/o R abd pain onset x2 mths that radiates into the back, pt reports x 1 vomiting episode in the last 24 hrs, pt denies diarrhea, denies & SOB, A&O x4

## 2015-05-25 NOTE — Consult Note (Signed)
Reason for Consult: Biliary Obstruction Referring Physician: [facility]  Hardie PulleyLatisha Schneider HPI: This is a 40 year old female with a PMH of cholecystectomy in 2007 admitted with complaints of RUQ pain, epigastric pain, and right flank pain. Brianna Schneider states that Brianna Schneider has had this pain started a couple of months ago.  It has been intermittent.  Bloodwork was performed in 04/24/2015 and her liver enzymes were normal.  On 05/16/2015 Brianna Schneider was evaluated in [place] PCP to establish a primary care.  The thought was that Brianna Schneider had GERD and an ultrasound was offered, but Brianna Schneider wanted to try omeprazole first.  Further work up in the ER with a CT scan reveals markedly dilated intra/extrahepatic biliary ducts and a distal smooth CBD stricture.  No issues with ETOH and the pancreas is normal on the CT scan.  Her liver enzymes are markedly elevated:  AST 409, ALT 516, AP 475, TB 4.6.  Past Medical History  Diagnosis Date  . Anxiety   . GERD (gastroesophageal reflux disease)     Past Surgical History  Procedure Laterality Date  . Cholecystectomy  2007  . Wisdom tooth extraction      Family History  Problem Relation Age of Onset  . Hypertension Mother   . Arthritis Maternal Grandmother   . Breast cancer Maternal Grandmother     Social History:  reports that Brianna Schneider has never smoked. Brianna Schneider has never used smokeless tobacco. Brianna Schneider reports that Brianna Schneider drinks alcohol. Brianna Schneider reports that Brianna Schneider does not use illicit drugs.  Allergies: No Known Allergies  Medications:  Scheduled:  Continuous: . ciprofloxacin 400 mg (05/25/15 1835)    Results for orders placed or performed during the hospital encounter of 05/25/15 (from the past 24 hour(s))  Lipase, blood     Status: None   Collection Time: 05/25/15 11:28 AM  Result Value Ref Range   Lipase 17 11 - 51 U/L  Comprehensive metabolic panel     Status: Abnormal   Collection Time: 05/25/15 11:28 AM  Result Value Ref Range   Sodium 135 135 - 145 mmol/L   Potassium 4.0 3.5 - 5.1  mmol/L   Chloride 98 (L) 101 - 111 mmol/L   CO2 25 22 - 32 mmol/L   Glucose, Bld 147 (H) 65 - 99 mg/dL   BUN <5 (L) 6 - 20 mg/dL   Creatinine, Ser 5.620.69 0.44 - 1.00 mg/dL   Calcium 9.8 8.9 - 13.010.3 mg/dL   Total Protein 8.1 6.5 - 8.1 g/dL   Albumin 4.1 3.5 - 5.0 g/dL   AST 865409 (H) 15 - 41 U/L   ALT 516 (H) 14 - 54 U/L   Alkaline Phosphatase 475 (H) 38 - 126 U/L   Total Bilirubin 4.6 (H) 0.3 - 1.2 mg/dL   GFR calc non Af Amer >60 >60 mL/min   GFR calc Af Amer >60 >60 mL/min   Anion gap 12 5 - 15  CBC     Status: Abnormal   Collection Time: 05/25/15 11:28 AM  Result Value Ref Range   WBC 16.8 (H) 4.0 - 10.5 K/uL   RBC 4.59 3.87 - 5.11 MIL/uL   Hemoglobin 14.4 12.0 - 15.0 g/dL   HCT 78.441.5 69.636.0 - 29.546.0 %   MCV 90.4 78.0 - 100.0 fL   MCH 31.4 26.0 - 34.0 pg   MCHC 34.7 30.0 - 36.0 g/dL   RDW 28.413.3 13.211.5 - 44.015.5 %   Platelets 286 150 - 400 K/uL  I-Stat beta hCG blood, ED  Status: None   Collection Time: 05/25/15 11:38 AM  Result Value Ref Range   I-stat hCG, quantitative <5.0 <5 mIU/mL   Comment 3          Urinalysis, Routine w reflex microscopic (not at [facility])     Status: Abnormal   Collection Time: 05/25/15 12:43 PM  Result Value Ref Range   Color, Urine AMBER (A) YELLOW   APPearance CLEAR CLEAR   Specific Gravity, Urine 1.018 1.005 - 1.030   pH 6.5 5.0 - 8.0   Glucose, UA NEGATIVE NEGATIVE mg/dL   Hgb urine dipstick NEGATIVE NEGATIVE   Bilirubin Urine MODERATE (A) NEGATIVE   Ketones, ur 15 (A) NEGATIVE mg/dL   Protein, ur NEGATIVE NEGATIVE mg/dL   Nitrite NEGATIVE NEGATIVE   Leukocytes, UA NEGATIVE NEGATIVE     Ct Abdomen Pelvis W Contrast  05/25/2015  CLINICAL DATA:  Abdominal pain and vomiting. Elevated liver function tests. Prior cholecystectomy. EXAM: CT ABDOMEN AND PELVIS WITH CONTRAST TECHNIQUE: Multidetector CT imaging of the abdomen and pelvis was performed using the standard protocol following bolus administration of intravenous contrast. CONTRAST:  OMNIPAQUE  IOHEXOL 300 MG/ML  SOLN COMPARISON:  None. FINDINGS: Lower chest:  No acute findings. Hepatobiliary: No liver masses identified. Prior cholecystectomy noted. Severe diffuse intra and extrahepatic biliary ductal dilatation is seen. Common bile duct measures 17 mm in diameter. Smooth tapering of the distal common bile duct is seen in the pancreatic head. No calcified common duct stones seen. Pancreas: No evidence of pancreatic mass or pancreatic ductal dilatation. No evidence of peripancreatic inflammatory changes or fluid collections. No evidence of pancreatic calcifications. Spleen: Within normal limits in size and appearance. Adrenals/Urinary Tract: No masses identified. No evidence of hydronephrosis. Stomach/Bowel: No evidence of obstruction, inflammatory process, or abnormal fluid collections. Vascular/Lymphatic: No pathologically enlarged lymph nodes. No evidence of abdominal aortic aneurysm. Reproductive: No mass or other significant abnormality. Other: None. Musculoskeletal:  No suspicious bone lesions identified. IMPRESSION: Prior cholecystectomy. 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 No definite choledocholithiasis or pancreatic mass visualized by CT. Consider a abdomen MRI and MRCP without and with contrast for further evaluation. Electronically Signed   By: Myles Rosenthal M.D.   On: 05/25/2015 14:59    ROS:  As stated above in the HPI otherwise negative.  Blood pressure 100/64, pulse 107, temperature 99.3 F (37.4 C), temperature source Oral, resp. rate 18, height 5\' 2"  (1.575 m), weight 99.338 kg (219 lb), last menstrual period 04/16/2015, SpO2 97 %.    PE: Gen: NAD, Alert and Oriented, but sleepy from the pain medication HEENT:  [place]/AT, EOMI, scleral icterus Neck: Supple, no LAD Lungs: CTA Bilaterally CV: RRR without M/G/R ABM: Soft, NTND, +BS Ext: No C/C/E  Assessment/Plan: 1) Distal biliary stricture  with resultant obstruction. 2) Elevated liver enzymes. 3) RUQ/Epigastric abdominal pain. 4) Elevated WBC.   There appears to be a nonmalignant distal biliary stricture.  The etiology is unknown at this time, but brushings of the stricture will be obtained.  Brianna Schneider will require stenting to get over this acute obstruction.  Long term management will be the question, i.e., repeated stenting, surgery, or resolution of the stricture.  Brianna Schneider does not recall the reason for her cholecystectomy and it was performed in [place], [place].  The patient does not recall any complications from the surgery.  Also, there is no known family history of IBD, personal history of IBD to suggest PSC as an  etiology, or autoimmune history.  Again, her liver enzymes were normal before the obstruction developed, which argues against an autoimmune cholangitis or PSC.  Plan: 1) ERCP with stenting tomorrow AM. 2) Ciprofloxacin 400 mg IV q12 hours.  Dyson Sevey D 05/25/2015, 6:34 PM

## 2015-05-25 NOTE — ED Provider Notes (Signed)
CSN: [id number]     Arrival date & time 05/25/15  0845 History   First MD Initiated Contact with Patient 05/25/15 1108     Chief Complaint  Patient presents with  . Abdominal Pain     (Consider location/radiation/quality/duration/timing/severity/associated sxs/prior Treatment) The history is provided by the patient. No language interpreter was used.     Brianna Schneider is a(n) 40 y.o. female who presents  To the ED w cc of RUQ and epigastric abdominal. She states that the pain has been intermittent for the past 2 months. She complains of epigastric pain that radiates to her back on the right. She has seen her primary care twice about this and was told that she has reflux. She is a previous surgical history of cholecystectomy. Her pain began last night around 7:30 PM. Gradually worsening. Should 1 episode of vomiting, nonbloody, nonbilious vomitus this morning. She denies constipation or diarrhea. She describes the pain as burning, aching. Nothing seems to make it worse or better. It does not appear to be postprandial, as her last meal was 2:30 PM yesterday. A chest pain, shortness of breath. Last menstrual period was 05/16/2014.  Past Medical History  Diagnosis Date  . Anxiety   . GERD (gastroesophageal reflux disease)    Past Surgical History  Procedure Laterality Date  . Cholecystectomy  2007  . Wisdom tooth extraction     Family History  Problem Relation Age of Onset  . Hypertension Mother   . Arthritis Maternal Grandmother   . Breast cancer Maternal Grandmother    Social History  Substance Use Topics  . Smoking status: Never Smoker   . Smokeless tobacco: Never Used  . Alcohol Use: 0.0 oz/week    0 Standard drinks or equivalent per week     Comment: occasional   OB History    No data available     Review of Systems  Ten systems reviewed and are negative for acute change, except as noted in the HPI.    Allergies  Review of patient's allergies indicates no known  allergies.  Home Medications   Prior to Admission medications   Medication Sig Start Date End Date Taking? Authorizing Provider  citalopram (CELEXA) 10 MG tablet Take 10 mg by mouth daily. Reported on 05/16/2015 03/04/15   Historical Provider, MD   BP 149/81 mmHg  Pulse 77  Temp(Src) 99.3 F (37.4 C) (Oral)  Resp 16  Ht 5\' 2"  (1.575 m)  Wt 99.338 kg  BMI 40.05 kg/m2  SpO2 100%  LMP 04/16/2015 Physical Exam  Constitutional: She is oriented to person, place, and time. She appears well-developed and well-nourished. No distress.  HENT:  Head: Normocephalic and atraumatic.  Eyes: Conjunctivae are normal. No scleral icterus.  Neck: Normal range of motion.  Cardiovascular: Normal rate, regular rhythm and normal heart sounds.  Exam reveals no gallop and no friction rub.   No murmur heard. Pulmonary/Chest: Effort normal and breath sounds normal. No respiratory distress.  Abdominal: Soft. Bowel sounds are normal. She exhibits no distension and no mass. There is no tenderness. There is no guarding.    Neurological: She is alert and oriented to person, place, and time.  Skin: Skin is warm and dry. She is not diaphoretic.  Nursing note and vitals reviewed.   ED Course  Procedures (including critical care time) Labs Review Labs Reviewed  COMPREHENSIVE METABOLIC PANEL - Abnormal; Notable for the following:    Chloride 98 (*)    Glucose, Bld 147 (*)  BUN <5 (*)    AST 409 (*)    ALT 516 (*)    Alkaline Phosphatase 475 (*)    Total Bilirubin 4.6 (*)    All other components within normal limits  CBC - Abnormal; Notable for the following:    WBC 16.8 (*)    All other components within normal limits  URINALYSIS, ROUTINE W REFLEX MICROSCOPIC (NOT AT [facility]) - Abnormal; Notable for the following:    Color, Urine AMBER (*)    Bilirubin Urine MODERATE (*)    Ketones, ur 15 (*)    All other components within normal limits  LIPASE, BLOOD  I-STAT BETA HCG BLOOD, ED ([facility], [facility], [facility] ONLY)     Imaging Review No results found. I have personally reviewed and evaluated these images and lab results as part of my medical decision-making.   EKG Interpretation None      MDM   Final diagnoses:  Elevated liver enzymes  Bile duct, common, cystic dilatation  RUQ abdominal pain    11:48 AM BP 140/77 mmHg  Pulse 75  Temp(Src) 99.3 F (37.4 C) (Oral)  Resp 18  Ht 5\' 2"  (1.575 m)  Wt 99.338 kg  BMI 40.05 kg/m2  SpO2 100%  LMP 04/16/2015 Here with right upper quadrant abdominal pain ongoing for the past 2 months. She has a history of previous cholecystectomy. Labs are pending. Patient will be given pain medication, IV fluids. No active vomiting at this time.   1:44 PM BP 143/79 mmHg  Pulse 91  Temp(Src) 99.3 F (37.4 C) (Oral)  Resp 18  Ht 5\' 2"  (1.575 m)  Wt 99.338 kg  BMI 40.05 kg/m2  SpO2 97%  LMP 04/16/2015 Patient with large elevation in her alkaline phosphatase, ALT and AST.  Total bilirubin is also quite elevated. Patient is also spilling bilirubin into her urinary stream. With the high elevation in her alkaline phosphatase. I am concerned for possible blockage of the cystic duct, although patient has had her gallbladder removed about 7 years ago. Ct abd/pelvis ordered.   Patient with CT shows gallbladder stricture and dilitation. I have discussed the patietn with hospitalist who will admit the patient. Dr. Elnoria Howard will consult.    Arthor Captain, PA-C 05/28/15 1307  Pricilla Loveless, MD 05/31/15 1012

## 2015-05-25 NOTE — Anesthesia Preprocedure Evaluation (Addendum)
Anesthesia Evaluation  Patient identified by MRN, date of birth, ID band Patient awake    Reviewed: Allergy & Precautions, NPO status , Patient's Chart, lab work & pertinent test results  Airway Mallampati: I  TM Distance: <3 FB Neck ROM: Full    Dental  (+) Teeth Intact, Dental Advisory Given   Pulmonary neg pulmonary ROS,    Pulmonary exam normal breath sounds clear to auscultation       Cardiovascular Exercise Tolerance: Good (-) hypertension(-) angina(-) CAD and (-) Past MI negative cardio ROS Normal cardiovascular exam Rhythm:Regular Rate:Normal     Neuro/Psych PSYCHIATRIC DISORDERS Anxiety negative neurological ROS     GI/Hepatic GERD  Medicated,CBD obstruction; biliary stricture   Endo/Other  Morbid obesity  Renal/GU negative Renal ROS     Musculoskeletal negative musculoskeletal ROS (+)   Abdominal   Peds  Hematology negative hematology ROS (+)   Anesthesia Other Findings Day of surgery medications reviewed with the patient.  Reproductive/Obstetrics negative OB ROS                           Anesthesia Physical Anesthesia Plan  ASA: III  Anesthesia Plan: General   Post-op Pain Management:    Induction: Intravenous  Airway Management Planned: Oral ETT  Additional Equipment:   Intra-op Plan:   Post-operative Plan: Extubation in OR  Informed Consent: I have reviewed the patients History and Physical, chart, labs and discussed the procedure including the risks, benefits and alternatives for the proposed anesthesia with the patient or authorized representative who has indicated his/her understanding and acceptance.   Dental advisory given  Plan Discussed with: CRNA  Anesthesia Plan Comments: (Risks/benefits of general anesthesia discussed with patient including risk of damage to teeth, lips, gum, and tongue, nausea/vomiting, allergic reactions to medications, and the  possibility of heart attack, stroke and death.  All patient questions answered.  Patient wishes to proceed.)        Anesthesia Quick Evaluation

## 2015-05-25 NOTE — H&P (Signed)
[facility] History and Physical  Samadhi Mahurin ZOX:[id number] DOB: 14-Nov-1975 DOA: 05/25/2015  Referring physician: Emergency Department PCP: Nicki Reaper, NP   CHIEF COMPLAINT:   Right upper abdominal pain     HPI: Brianna Schneider is a 40 y.o. female  With no significant PMH presenting to ED with intermittent RUQ pain radation       ED COURSE:           Labs:   WBC 16.8 Alkaline phosphatase 475, AST 49, ALT 16, total bilirubin 4.6. Lipase normal at 17  Urinalysis:    Clear, negative leukocytes, negative nitrites,              Medications  ondansetron (ZOFRAN) injection 4 mg (4 mg Intravenous Given 05/25/15 1131)  morphine 4 MG/ML injection 4 mg (4 mg Intravenous Given 05/25/15 1131)  HYDROmorphone (DILAUDID) injection 1 mg (1 mg Intravenous Given 05/25/15 1248)  iohexol (OMNIPAQUE) 300 MG/ML solution 100 mL (100 mLs Intravenous Contrast Given 05/25/15 1433)  HYDROmorphone (DILAUDID) injection 1 mg (1 mg Intravenous Given 05/25/15 1648)    Review of Systems  Constitutional: Negative.   HENT: Negative.   Eyes: Negative.   Respiratory: Negative.   Cardiovascular: Negative.   Gastrointestinal: Positive for nausea and abdominal pain.  Genitourinary: Negative.   Musculoskeletal: Negative.   Skin: Negative.   Endo/Heme/Allergies: Negative.   Psychiatric/Behavioral: Negative.     Past Medical History  Diagnosis Date  . Anxiety   . GERD (gastroesophageal reflux disease)    Past Surgical History  Procedure Laterality Date  . Cholecystectomy  2007  . Wisdom tooth extraction      SOCIAL HISTORY:  reports that she has never smoked. She has never used smokeless tobacco. She reports that she drinks alcohol. She reports that she does not use illicit drugs. Lives: at home with family   Assistive devices:   None needed for ambulation.   No Known Allergies  Family History  Problem Relation Age of Onset  . Hypertension Mother   . Arthritis Maternal Grandmother   .  Breast cancer Maternal Grandmother     Prior to Admission medications   Medication Sig Start Date End Date Taking? Authorizing Provider  omeprazole (PRILOSEC) 20 MG capsule Take 20 mg by mouth daily.   Yes Historical Provider, MD   PHYSICAL EXAM: Filed Vitals:   05/25/15 1630 05/25/15 1645 05/25/15 1700 05/25/15 1715  BP: 119/67 135/98 125/76 114/73  Pulse: 111 103 106 110  Temp:      TempSrc:      Resp:  18  18  Height:      Weight:      SpO2: 100% 99% 98% 97%    Wt Readings from Last 3 Encounters:  05/25/15 99.338 kg (219 lb)  05/16/15 99.701 kg (219 lb 12.8 oz)  04/24/15 102.513 kg (226 lb)    General:  Pleasant obese black female. Appears calm and comfortable Eyes: PER, normal lids, irises & conjunctiva ENT: grossly normal hearing, lips & tongue Neck: no LAD, no masses Cardiovascular: RRR, no murmurs. No LE edema.  Respiratory: Respirations even and unlabored. Normal respiratory effort. Lungs CTA bilaterally, no wheezes / rales .   Abdomen: soft, non-distended, non-tender, active bowel sounds. No obvious masses.  Skin: no rash seen on limited exam Musculoskeletal: grossly normal tone BUE/BLE Psychiatric: grossly normal mood and affect, speech fluent and appropriate Neurologic: grossly non-focal.         LABS ON ADMISSION:    Basic Metabolic Panel:  Recent  Labs Lab 05/25/15 1128  NA 135  K 4.0  CL 98*  CO2 25  GLUCOSE 147*  BUN <5*  CREATININE 0.69  CALCIUM 9.8   Liver Function Tests:  Recent Labs Lab 05/25/15 1128  AST 409*  ALT 516*  ALKPHOS 475*  BILITOT 4.6*  PROT 8.1  ALBUMIN 4.1    Recent Labs Lab 05/25/15 1128  LIPASE 17   CBC:  Recent Labs Lab 05/25/15 1128  WBC 16.8*  HGB 14.4  HCT 41.5  MCV 90.4  PLT 286    CREATININE: 0.69 (05/25/15 1128) Estimated creatinine clearance - 105.1 mL/min  Radiological Exams on Admission: Ct Abdomen Pelvis W Contrast  05/25/2015  CLINICAL DATA:  Abdominal pain and vomiting. Elevated liver  function tests. Prior cholecystectomy. EXAM: CT ABDOMEN AND PELVIS WITH CONTRAST TECHNIQUE: Multidetector CT imaging of the abdomen and pelvis was performed using the standard protocol following bolus administration of intravenous contrast. CONTRAST:  100mL OMNIPAQUE IOHEXOL 300 MG/ML  SOLN COMPARISON:  None. FINDINGS: Lower chest:  No acute findings. Hepatobiliary: No liver masses identified. Prior cholecystectomy noted. Severe diffuse intra and extrahepatic biliary ductal dilatation is seen. Common bile duct measures 17 mm in diameter. Smooth tapering of the distal common bile duct is seen in the pancreatic head. No calcified common duct stones seen. Pancreas: No evidence of pancreatic mass or pancreatic ductal dilatation. No evidence of peripancreatic inflammatory changes or fluid collections. No evidence of pancreatic calcifications. Spleen: Within normal limits in size and appearance. Adrenals/Urinary Tract: No masses identified. No evidence of hydronephrosis. Stomach/Bowel: No evidence of obstruction, inflammatory process, or abnormal fluid collections. Vascular/Lymphatic: No pathologically enlarged lymph nodes. No evidence of abdominal aortic aneurysm. Reproductive: No mass or other significant abnormality. Other: None. Musculoskeletal:  No suspicious bone lesions identified. IMPRESSION: Prior cholecystectomy. 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 No definite choledocholithiasis or pancreatic mass visualized by CT. Consider a abdomen MRI and MRCP without and with contrast for further evaluation. Electronically Signed   By: Myles RosenthalJohn  Stahl M.D.   On: 05/25/2015 14:59    ASSESSMENT / PLAN   RUQ pain / elevated LFTs (mixed pattern) / severe intrahepatic and extrahepatic biliary duct dilation on CTscan. Remote cholecystectomy for unclear reasons. No obvious pancreatic mass on CTscan. CBD tapers smoothly within pancreatic head  suggesting stricture. No evidence for CBD stones.  -admit to medical bed -GI has been consulted. -given leukocytosis in setting of biliary obstruction will start IV cipro. -am CMET, CBC  -Clear liquids only for now -pain control, anti-emetics.     CONSULTANTS:   Gastroenterology - Dr. Elnoria HowardHung called by EDP. Awaiting callback.    Code Status: fulll code DVT Prophylaxis: Heparin (short acting as patient may need endoscopic procedure).  Family Communication:  Patient alert, oriented and understands plan of care.  Disposition Plan: Discharge to home in 24-48 hours   Time spent: 60 minutes Willette ClusterPaula Guenther  NP [facility] Pager [phone]

## 2015-05-25 NOTE — ED Notes (Signed)
Attempted to draw pts labs was unsuccessful  

## 2015-05-26 ENCOUNTER — Inpatient Hospital Stay (HOSPITAL_COMMUNITY): Payer: BC Managed Care – PPO | Admitting: Certified Registered Nurse Anesthetist

## 2015-05-26 ENCOUNTER — Inpatient Hospital Stay (HOSPITAL_COMMUNITY): Payer: BC Managed Care – PPO

## 2015-05-26 ENCOUNTER — Encounter (HOSPITAL_COMMUNITY): Payer: Self-pay | Admitting: *Deleted

## 2015-05-26 ENCOUNTER — Encounter (HOSPITAL_COMMUNITY)
Admission: EM | Disposition: A | Discharge status: Home / Self Care | Payer: Self-pay | Source: Home / Self Care | Attending: Internal Medicine

## 2015-05-26 DIAGNOSIS — R7989 Other specified abnormal findings of blood chemistry: Secondary | ICD-10-CM

## 2015-05-26 DIAGNOSIS — R7401 Elevation of levels of liver transaminase levels: Secondary | ICD-10-CM | POA: Diagnosis present

## 2015-05-26 DIAGNOSIS — K219 Gastro-esophageal reflux disease without esophagitis: Secondary | ICD-10-CM | POA: Insufficient documentation

## 2015-05-26 DIAGNOSIS — R740 Nonspecific elevation of levels of transaminase and lactic acid dehydrogenase [LDH]: Secondary | ICD-10-CM

## 2015-05-26 DIAGNOSIS — K8051 Calculus of bile duct without cholangitis or cholecystitis with obstruction: Principal | ICD-10-CM

## 2015-05-26 HISTORY — PX: ERCP: SHX5425

## 2015-05-26 LAB — LIPASE, BLOOD: Lipase: 16 U/L (ref 11–51)

## 2015-05-26 LAB — COMPREHENSIVE METABOLIC PANEL
ALT: 300 U/L — ABNORMAL HIGH (ref 14–54)
AST: 134 U/L — ABNORMAL HIGH (ref 15–41)
Albumin: 3.2 g/dL — ABNORMAL LOW (ref 3.5–5.0)
Alkaline Phosphatase: 392 U/L — ABNORMAL HIGH (ref 38–126)
Anion gap: 9 (ref 5–15)
BUN: <5 mg/dL — ABNORMAL LOW (ref 6–20)
CO2: 26 mmol/L (ref 22–32)
Calcium: 9.1 mg/dL (ref 8.9–10.3)
Chloride: 103 mmol/L (ref 101–111)
Creatinine, Ser: 0.75 mg/dL (ref 0.44–1.00)
GFR calc Af Amer: >60 mL/min (ref >60)
GFR calc non Af Amer: >60 mL/min (ref >60)
Glucose, Bld: 117 mg/dL — ABNORMAL HIGH (ref 65–99)
Potassium: 3.6 mmol/L (ref 3.5–5.1)
Sodium: 138 mmol/L (ref 135–145)
Total Bilirubin: 6.1 mg/dL — ABNORMAL HIGH (ref 0.3–1.2)
Total Protein: 7.1 g/dL (ref 6.5–8.1)

## 2015-05-26 LAB — CBC
HCT: 38.1 % (ref 36.0–46.0)
Hemoglobin: 12.7 g/dL (ref 12.0–15.0)
MCH: 30.2 pg (ref 26.0–34.0)
MCHC: 33.3 g/dL (ref 30.0–36.0)
MCV: 90.5 fL (ref 78.0–100.0)
Platelets: 251 10*3/uL (ref 150–400)
RBC: 4.21 MIL/uL (ref 3.87–5.11)
RDW: 13.9 % (ref 11.5–15.5)
WBC: 12.4 10*3/uL — ABNORMAL HIGH (ref 4.0–10.5)

## 2015-05-26 SURGERY — ERCP, WITH INTERVENTION IF INDICATED
Anesthesia: General

## 2015-05-26 MED ORDER — LORAZEPAM 2 MG/ML IJ SOLN
0.5000 mg | Freq: Once | INTRAMUSCULAR | Status: AC
  Administered 2015-05-26: 0.5 mg via INTRAVENOUS
  Filled 2015-05-26: qty 1

## 2015-05-26 MED ORDER — IOHEXOL 350 MG/ML SOLN
INTRAVENOUS | Status: DC | PRN
  Administered 2015-05-26: 30 mL

## 2015-05-26 MED ORDER — MIDAZOLAM HCL 5 MG/5ML IJ SOLN
INTRAMUSCULAR | Status: DC | PRN
  Administered 2015-05-26: 2 mg via INTRAVENOUS

## 2015-05-26 MED ORDER — SUCCINYLCHOLINE CHLORIDE 20 MG/ML IJ SOLN
INTRAMUSCULAR | Status: DC | PRN
  Administered 2015-05-26: 100 mg via INTRAVENOUS

## 2015-05-26 MED ORDER — ONDANSETRON HCL 4 MG/2ML IJ SOLN
INTRAMUSCULAR | Status: DC | PRN
  Administered 2015-05-26: 4 mg via INTRAVENOUS

## 2015-05-26 MED ORDER — FENTANYL CITRATE (PF) 100 MCG/2ML IJ SOLN
INTRAMUSCULAR | Status: DC | PRN
  Administered 2015-05-26: 100 ug via INTRAVENOUS

## 2015-05-26 MED ORDER — LACTATED RINGERS IV SOLN
INTRAVENOUS | Status: DC
  Administered 2015-05-26: 07:00:00 via INTRAVENOUS

## 2015-05-26 MED ORDER — MORPHINE SULFATE (PF) 2 MG/ML IV SOLN
1.0000 mg | Freq: Once | INTRAVENOUS | Status: AC
  Administered 2015-05-26: 1 mg via INTRAVENOUS
  Filled 2015-05-26: qty 1

## 2015-05-26 MED ORDER — SUGAMMADEX SODIUM 200 MG/2ML IV SOLN
INTRAVENOUS | Status: DC | PRN
  Administered 2015-05-26: 200 mg via INTRAVENOUS

## 2015-05-26 MED ORDER — PHENYLEPHRINE HCL 10 MG/ML IJ SOLN
10.0000 mg | INTRAVENOUS | Status: DC | PRN
  Administered 2015-05-26: 50 ug/min via INTRAVENOUS

## 2015-05-26 MED ORDER — ROCURONIUM BROMIDE 100 MG/10ML IV SOLN
INTRAVENOUS | Status: DC | PRN
  Administered 2015-05-26: 20 mg via INTRAVENOUS

## 2015-05-26 MED ORDER — PHENYLEPHRINE HCL 10 MG/ML IJ SOLN
INTRAMUSCULAR | Status: DC | PRN
  Administered 2015-05-26: 120 ug via INTRAVENOUS
  Administered 2015-05-26: 80 ug via INTRAVENOUS

## 2015-05-26 MED ORDER — LIDOCAINE HCL (CARDIAC) 20 MG/ML IV SOLN
INTRAVENOUS | Status: DC | PRN
  Administered 2015-05-26: 100 mg via INTRAVENOUS

## 2015-05-26 MED ORDER — DEXAMETHASONE SODIUM PHOSPHATE 4 MG/ML IJ SOLN
INTRAMUSCULAR | Status: DC | PRN
  Administered 2015-05-26: 8 mg via INTRAVENOUS

## 2015-05-26 NOTE — Progress Notes (Signed)
[facility] PROGRESS NOTE  Brianna Schneider ZOX:[id number] DOB: [birth year] DOA: 05/25/2015 PCP: Nicki Reaper, NP  Assessment/Plan: #1 choledocholithiasis with obstruction Questionable etiology. Patient status post cholecystectomy several years ago. Patient presented with abdominal pain, transaminitis, CT findings worrisome for biliary stricture. Patient status post ERCP with removal of calculus/calculi. LFTs trending down. Continue empiric IV antibiotics. GI following and appreciate input and recommendations.  #2 transaminitis Secondary to Toprol No. 1. Patient status post ERCP with removal of calculus/calculi. Follow LFTs. Per GI.  #3 gastroesophageal reflux disease PPI.  #4 prophylaxis Heparin for DVT prophylaxis.  Code Status: Full Family Communication: Updated patient and family at bedside. Disposition Plan: Home when medically stable and per GI.   Consultants:  Gastroenterology: Dr Elnoria Howard 05/25/2015  Procedures:  CT abdomen and pelvis 05/25/2015  ERCP with removal of calculus/calculi. Dr. Elnoria Howard 05/26/2015  Antibiotics:  IV ciprofloxacin 05/25/2015  HPI/Subjective: Patient laying in bed post ERCP with stone extraction. Patient denies any nausea or emesis. Patient states abdominal pain has improved.  Objective: Filed Vitals:   05/26/15 0830 05/26/15 1420  BP:  140/86  Pulse: 117 92  Temp: 98.3 F (36.8 C) 99.5 F (37.5 C)  Resp: 16 16    Intake/Output Summary (Last 24 hours) at 05/26/15 1653 Last data filed at 05/26/15 1625  Gross per 24 hour  Intake   1520 ml  Output   2650 ml  Net  -1130 ml   Filed Weights   05/25/15 0946 05/25/15 1956 05/26/15 0705  Weight: 99.338 kg (219 lb) 99.7 kg (219 lb 12.8 oz) 99.338 kg (219 lb)    Exam:   General:  NAD  Cardiovascular: RRR  Respiratory: CTAB  Abdomen: Soft, nontender, nondistended, positive bowel sounds.  Musculoskeletal: No clubbing cyanosis or edema.  Data Reviewed: Basic Metabolic  Panel:  Recent Labs Lab 05/25/15 1128 05/26/15 1047  NA 135 138  K 4.0 3.6  CL 98* 103  CO2 25 26  GLUCOSE 147* 117*  BUN <5* <5*  CREATININE 0.69 0.75  CALCIUM 9.8 9.1   Liver Function Tests:  Recent Labs Lab 05/25/15 1128 05/26/15 1047  AST 409* 134*  ALT 516* 300*  ALKPHOS 475* 392*  BILITOT 4.6* 6.1*  PROT 8.1 7.1  ALBUMIN 4.1 3.2*    Recent Labs Lab 05/25/15 1128 05/26/15 1047  LIPASE 17 16   No results for input(s): AMMONIA in the last 168 hours. CBC:  Recent Labs Lab 05/25/15 1128 05/26/15 1047  WBC 16.8* 12.4*  HGB 14.4 12.7  HCT 41.5 38.1  MCV 90.4 90.5  PLT 286 251   Cardiac Enzymes: No results for input(s): CKTOTAL, CKMB, CKMBINDEX, TROPONINI in the last 168 hours. BNP (last 3 results) No results for input(s): BNP in the last 8760 hours.  ProBNP (last 3 results) No results for input(s): PROBNP in the last 8760 hours.  CBG: No results for input(s): GLUCAP in the last 168 hours.  No results found for this or any previous visit (from the past 240 hour(s)).   Studies: Ct Abdomen Pelvis W Contrast  05/25/2015  CLINICAL DATA:  Abdominal pain and vomiting. Elevated liver function tests. Prior cholecystectomy. EXAM: CT ABDOMEN AND PELVIS WITH CONTRAST TECHNIQUE: Multidetector CT imaging of the abdomen and pelvis was performed using the standard protocol following bolus administration of intravenous contrast. CONTRAST:  OMNIPAQUE IOHEXOL 300 MG/ML  SOLN COMPARISON:  None. FINDINGS: Lower chest:  No acute findings. Hepatobiliary: No liver masses identified. Prior cholecystectomy noted. Severe diffuse intra and extrahepatic biliary ductal  dilatation is seen. Common bile duct measures 17 mm in diameter. Smooth tapering of the distal common bile duct is seen in the pancreatic head. No calcified common duct stones seen. Pancreas: No evidence of pancreatic mass or pancreatic ductal dilatation. No evidence of peripancreatic inflammatory changes or fluid  collections. No evidence of pancreatic calcifications. Spleen: Within normal limits in size and appearance. Adrenals/Urinary Tract: No masses identified. No evidence of hydronephrosis. Stomach/Bowel: No evidence of obstruction, inflammatory process, or abnormal fluid collections. Vascular/Lymphatic: No pathologically enlarged lymph nodes. No evidence of abdominal aortic aneurysm. Reproductive: No mass or other significant abnormality. Other: None. Musculoskeletal:  No suspicious bone lesions identified. IMPRESSION: Prior cholecystectomy. 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 No definite choledocholithiasis or pancreatic mass visualized by CT. Consider a abdomen MRI and MRCP without and with contrast for further evaluation. Electronically Signed   By: Myles RosenthalJohn  Stahl M.D.   On: 05/25/2015 14:59   Dg Ercp Biliary & Pancreatic Ducts  05/26/2015  CLINICAL DATA:  Biliary obstruction and choledocholithiasis. EXAM: ERCP TECHNIQUE: Multiple spot images obtained with the fluoroscopic device and submitted for interpretation post-procedure. COMPARISON:  CT of the abdomen on 05/25/2015. FINDINGS: Imaging obtained with a C-arm demonstrates cannulation of the common bile duct with contrast injection showing a diffusely dilated common bile duct and diffuse dilatation of intrahepatic ducts. The gallbladder has been removed. Balloon extraction of calculi was performed. Completion cholangiogram shows no discrete filling defects in the opacified ducts. IMPRESSION: Imaging during ERCP shows diffuse biliary dilatation. After balloon extraction of calculi, completion cholangiogram shows no discrete filling defects. These images were submitted for radiologic interpretation only. Please see the procedural report for the amount of contrast and the fluoroscopy time utilized. Electronically Signed   By: Irish LackGlenn  Yamagata M.D.   On: 05/26/2015 11:19     Scheduled Meds: . ciprofloxacin  400 mg Intravenous Q24H  . heparin  5,000 Units Subcutaneous 3 times per day   Continuous Infusions: . sodium chloride 75 mL/hr at 05/26/15 1357    Principal Problem:   Choledocholithiasis with obstruction Active Problems:   Transaminitis   Elevated LFTs   RUQ pain   Dilated bile duct   Common bile duct (CBD) obstruction   RUQ abdominal pain    Time spent: 35 minutes    Sondi Desch M.D. [facility] Pager [phone]. If 7PM-7AM, please contact night-coverage at [email or web address], password [email or web address] 05/26/2015, 4:53 PM  LOS: 1 day

## 2015-05-26 NOTE — Op Note (Signed)
[facility] [address] [place], [address]   ERCP PROCEDURE REPORT        EXAM DATE: 06-12-15  PATIENT NAME:          Brianna Schneider, Brianna Schneider          MR #: [id number] BIRTHDATE:       1976/03/09     VISIT #:     [id number] ATTENDING:     Jeani Hawking, MD     STATUS:     outpatient ASSISTANT:      Olene Craven and Dwain Sarna   INDICATIONS:  The patient is a 40 yr old female here for an ERCP due to biliary obstruction PROCEDURE PERFORMED:     ERCP with removal of calculus/calculi  MEDICATIONS:     General anesthesia  CONSENT: The patient understands the risks and benefits of the procedure and understands that these risks include, but are not limited to: sedation, allergic reaction, infection, perforation and/or bleeding. Alternative means of evaluation and treatment include, among others: physical exam, x-rays, and/or surgical intervention. The patient elects to proceed with this endoscopic procedure.  DESCRIPTION OF PROCEDURE: During intra-op preparation period all mechanical & medical equipment was checked for proper function. Hand hygiene and appropriate measures for infection prevention was taken. After the risks, benefits and alternatives of the procedure were thoroughly explained, Informed was verified, confirmed and timeout was successfully executed by the treatment team. With the patient in left semi-prone position, medications were administered intravenously.The Pentax Ercp Scope [id number] was passed from the mouth into the esophagus and further advanced from the esophagus into the stomach. From stomach scope was directed to the second portion of the duodenum.  Major papilla was aligned with the duodenoscope. The scope position was confirmed fluoroscopically. Rest of the findings/therapeutics are given below. The scope was then completely withdrawn from the patient and the procedure completed. The pulse, BP, and O2 saturation  were monitored and documented by the physician and the nursing staff throughout the entire procedure. The patient was cared for as planned according to standard protocol. The patient was then discharged to recovery in stable condition and with appropriate post procedure care. Estimated blood loss is zero unless otherwise noted in this procedure report.  The ampulla was located the second portion of the duodenum and there was spontaneous drainage of bile.  The CBD was cannulated during the first attempt.  No cannulation of the PD occurred.  The guidewire was secured in the left intrahepatic ducts.  Contrast injection revealed a dialted CBD at 15 mm and dilated intrahepatic ducts.  In the distal CBD there was a lucency, but no overt signs of a stricture.  A generous sphincterotomy was created.  The extraction balloon was inserted and during the second pass multiple compacted stone fragments were extracted.  The CBD was swept again and no further stones were removed.  The occlusion cholangiogram was negative for any retained stones or biliary strictures.  At this time point, I elected not to place a stent..    ADVERSE EVENT:     No immediate. IMPRESSIONS:     Choledocholithiasis s/p successful stone extraction.  RECOMMENDATIONS:     1) Continue with antibiotics. 2) Follow liver panel. REPEAT EXAM:   ___________________________________ Jeani Hawking, MD eSigned:  Jeani Hawking, MD 06-12-15 8:26 AM   cc:  CPT CODES: ICD9 CODES:  The ICD and CPT codes recommended by this software are interpretations from the data that the clinical staff has captured  with the software.  The verification of the translation of this report to the ICD and CPT codes and modifiers is the sole responsibility of the health care institution and practicing physician where this report was generated.  [employer] will not be held responsible for the validity of the ICD and CPT codes  included on this report.  AMA assumes no liability for data contained or not contained herein. CPT is a Publishing rights managerregistered trademark of the [employer].   PATIENT NAME:  Brianna Schneider, Brianna Schneider MR#: [id number]

## 2015-05-26 NOTE — Progress Notes (Signed)
Utilization review completed. Sukari Grist, RN, BSN. 

## 2015-05-26 NOTE — Interval H&P Note (Signed)
History and Physical Interval Note:  05/26/2015 7:11 AM  Brianna Schneider  has presented today for surgery, with the diagnosis of Biliary stricture  The various methods of treatment have been discussed with the patient and family. After consideration of risks, benefits and other options for treatment, the patient has consented to  Procedure(s): ENDOSCOPIC RETROGRADE CHOLANGIOPANCREATOGRAPHY (ERCP) (N/A) as a surgical intervention .  The patient's history has been reviewed, patient examined, no change in status, stable for surgery.  I have reviewed the patient's chart and labs.  Questions were answered to the patient's satisfaction.     Jerris Keltz D

## 2015-05-26 NOTE — Transfer of Care (Signed)
Immediate Anesthesia Transfer of Care Note  Patient: Brianna PulleyLatisha Buikema  Procedure(s) Performed: Procedure(s): ENDOSCOPIC RETROGRADE CHOLANGIOPANCREATOGRAPHY (ERCP) (N/A)  Patient Location: Endoscopy Unit  Anesthesia Type:General  Level of Consciousness: awake, alert , oriented and patient cooperative  Airway & Oxygen Therapy: Patient Spontanous Breathing and Patient connected to nasal cannula oxygen  Post-op Assessment: Report given to RN, Post -op Vital signs reviewed and stable and Patient moving all extremities X 4  Post vital signs: Reviewed and stable  Last Vitals:  Filed Vitals:   05/26/15 0705 05/26/15 0830  BP: 160/82   Pulse: 115 117  Temp: 36.9 C 36.8 C  Resp: 16 16    Complications: No apparent anesthesia complications

## 2015-05-26 NOTE — Anesthesia Procedure Notes (Signed)
Procedure Name: Intubation Date/Time: 05/26/2015 7:44 AM Performed by: Fabian NovemberSOLHEIM, Tedford Berg SALOMAN Pre-anesthesia Checklist: Patient identified, Patient being monitored, Timeout performed, Emergency Drugs available and Suction available Patient Re-evaluated:Patient Re-evaluated prior to inductionOxygen Delivery Method: Circle System Utilized Preoxygenation: Pre-oxygenation with 100% oxygen Intubation Type: IV induction Ventilation: Mask ventilation without difficulty Laryngoscope Size: 3, McGraph and Mac Grade View: Grade I Tube type: Oral Tube size: 7.5 mm Number of attempts: 3 Airway Equipment and Method: Stylet,  Video-laryngoscopy and Bougie stylet Placement Confirmation: ETT inserted through vocal cords under direct vision,  positive ETCO2 and breath sounds checked- equal and bilateral Secured at: 21 cm Tube secured with: Tape Dental Injury: Teeth and Oropharynx as per pre-operative assessment  Difficulty Due To: Difficulty was anticipated and Difficult Airway- due to anterior larynx Future Recommendations: Recommend- induction with short-acting agent, and alternative techniques readily available Comments: Anticipated difficult airway due to anterior larynx.  Attempt x1 by CRNA with Hyacinth MeekerMiller 3, grade 3 view but unable to pass ETT.  Attempt x1 by MDA with MAC3 and bougie stylet, but ETT unable to pass over bougie by seldinger technique.  McGrath Videoscope brought into room.  MDA with good view on video laryngoscopy, ETT passed under video visualization. +BBS, +Bilateral chest rise, +EtCO2

## 2015-05-26 NOTE — H&P (View-Only) (Signed)
Reason for Consult: Biliary Obstruction Referring Physician: [facility]  Brianna PulleyLatisha Schneider HPI: This is a 40 year old female with a PMH of cholecystectomy in 2007 admitted with complaints of RUQ pain, epigastric pain, and right flank pain. She states that she has had this pain started a couple of months ago.  It has been intermittent.  Bloodwork was performed in 04/24/2015 and her liver enzymes were normal.  On 05/16/2015 she was evaluated in [place] PCP to establish a primary care.  The thought was that she had GERD and an ultrasound was offered, but she wanted to try omeprazole first.  Further work up in the ER with a CT scan reveals markedly dilated intra/extrahepatic biliary ducts and a distal smooth CBD stricture.  No issues with ETOH and the pancreas is normal on the CT scan.  Her liver enzymes are markedly elevated:  AST 409, ALT 516, AP 475, TB 4.6.  Past Medical History  Diagnosis Date  . Anxiety   . GERD (gastroesophageal reflux disease)     Past Surgical History  Procedure Laterality Date  . Cholecystectomy  2007  . Wisdom tooth extraction      Family History  Problem Relation Age of Onset  . Hypertension Mother   . Arthritis Maternal Grandmother   . Breast cancer Maternal Grandmother     Social History:  reports that she has never smoked. She has never used smokeless tobacco. She reports that she drinks alcohol. She reports that she does not use illicit drugs.  Allergies: No Known Allergies  Medications:  Scheduled:  Continuous: . ciprofloxacin 400 mg (05/25/15 1835)    Results for orders placed or performed during the Schneider encounter of 05/25/15 (from the past 24 hour(s))  Lipase, blood     Status: None   Collection Time: 05/25/15 11:28 AM  Result Value Ref Range   Lipase 17 11 - 51 U/L  Comprehensive metabolic panel     Status: Abnormal   Collection Time: 05/25/15 11:28 AM  Result Value Ref Range   Sodium 135 135 - 145 mmol/L   Potassium 4.0 3.5 - 5.1  mmol/L   Chloride 98 (L) 101 - 111 mmol/L   CO2 25 22 - 32 mmol/L   Glucose, Bld 147 (H) 65 - 99 mg/dL   BUN <5 (L) 6 - 20 mg/dL   Creatinine, Ser 5.620.69 0.44 - 1.00 mg/dL   Calcium 9.8 8.9 - 13.010.3 mg/dL   Total Protein 8.1 6.5 - 8.1 g/dL   Albumin 4.1 3.5 - 5.0 g/dL   AST 865409 (H) 15 - 41 U/L   ALT 516 (H) 14 - 54 U/L   Alkaline Phosphatase 475 (H) 38 - 126 U/L   Total Bilirubin 4.6 (H) 0.3 - 1.2 mg/dL   GFR calc non Af Amer >60 >60 mL/min   GFR calc Af Amer >60 >60 mL/min   Anion gap 12 5 - 15  CBC     Status: Abnormal   Collection Time: 05/25/15 11:28 AM  Result Value Ref Range   WBC 16.8 (H) 4.0 - 10.5 K/uL   RBC 4.59 3.87 - 5.11 MIL/uL   Hemoglobin 14.4 12.0 - 15.0 g/dL   HCT 78.441.5 69.636.0 - 29.546.0 %   MCV 90.4 78.0 - 100.0 fL   MCH 31.4 26.0 - 34.0 pg   MCHC 34.7 30.0 - 36.0 g/dL   RDW 28.413.3 13.211.5 - 44.015.5 %   Platelets 286 150 - 400 K/uL  I-Stat beta hCG blood, ED  Status: None   Collection Time: 05/25/15 11:38 AM  Result Value Ref Range   I-stat hCG, quantitative <5.0 <5 mIU/mL   Comment 3          Urinalysis, Routine w reflex microscopic (not at Brianna Schneider)     Status: Abnormal   Collection Time: 05/25/15 12:43 PM  Result Value Ref Range   Color, Urine AMBER (A) YELLOW   APPearance CLEAR CLEAR   Specific Gravity, Urine 1.018 1.005 - 1.030   pH 6.5 5.0 - 8.0   Glucose, UA NEGATIVE NEGATIVE mg/dL   Hgb urine dipstick NEGATIVE NEGATIVE   Bilirubin Urine MODERATE (A) NEGATIVE   Ketones, ur 15 (A) NEGATIVE mg/dL   Protein, ur NEGATIVE NEGATIVE mg/dL   Nitrite NEGATIVE NEGATIVE   Leukocytes, UA NEGATIVE NEGATIVE     Ct Abdomen Pelvis W Contrast  05/25/2015  CLINICAL DATA:  Abdominal pain and vomiting. Elevated liver function tests. Prior cholecystectomy. EXAM: CT ABDOMEN AND PELVIS WITH CONTRAST TECHNIQUE: Multidetector CT imaging of the abdomen and pelvis was performed using the standard protocol following bolus administration of intravenous contrast. CONTRAST:  OMNIPAQUE  IOHEXOL 300 MG/ML  SOLN COMPARISON:  None. FINDINGS: Lower chest:  No acute findings. Hepatobiliary: No liver masses identified. Prior cholecystectomy noted. Severe diffuse intra and extrahepatic biliary ductal dilatation is seen. Common bile duct measures 17 mm in diameter. Smooth tapering of the distal common bile duct is seen in the pancreatic head. No calcified common duct stones seen. Pancreas: No evidence of pancreatic mass or pancreatic ductal dilatation. No evidence of peripancreatic inflammatory changes or fluid collections. No evidence of pancreatic calcifications. Spleen: Within normal limits in size and appearance. Adrenals/Urinary Tract: No masses identified. No evidence of hydronephrosis. Stomach/Bowel: No evidence of obstruction, inflammatory process, or abnormal fluid collections. Vascular/Lymphatic: No pathologically enlarged lymph nodes. No evidence of abdominal aortic aneurysm. Reproductive: No mass or other significant abnormality. Other: None. Musculoskeletal:  No suspicious bone lesions identified. IMPRESSION: Prior cholecystectomy. 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 No definite choledocholithiasis or pancreatic mass visualized by CT. Consider a abdomen MRI and MRCP without and with contrast for further evaluation. Electronically Signed   By: Myles Rosenthal M.D.   On: 05/25/2015 14:59    ROS:  As stated above in the HPI otherwise negative.  Blood pressure 100/64, pulse 107, temperature 99.3 F (37.4 C), temperature source Oral, resp. rate 18, height 5\' 2"  (1.575 m), weight 99.338 kg (219 lb), last menstrual period 04/16/2015, SpO2 97 %.    PE: Gen: NAD, Alert and Oriented, but sleepy from the pain medication HEENT:  [place]/AT, EOMI, scleral icterus Neck: Supple, no LAD Lungs: CTA Bilaterally CV: RRR without M/G/R ABM: Soft, NTND, +BS Ext: No C/C/E  Assessment/Plan: 1) Distal biliary stricture  with resultant obstruction. 2) Elevated liver enzymes. 3) RUQ/Epigastric abdominal pain. 4) Elevated WBC.   There appears to be a nonmalignant distal biliary stricture.  The etiology is unknown at this time, but brushings of the stricture will be obtained.  She will require stenting to get over this acute obstruction.  Long term management will be the question, i.e., repeated stenting, surgery, or resolution of the stricture.  She does not recall the reason for her cholecystectomy and it was performed in [place], [place].  The patient does not recall any complications from the surgery.  Also, there is no known family history of IBD, personal history of IBD to suggest PSC as an  etiology, or autoimmune history.  Again, her liver enzymes were normal before the obstruction developed, which argues against an autoimmune cholangitis or PSC.  Plan: 1) ERCP with stenting tomorrow AM. 2) Ciprofloxacin 400 mg IV q12 hours.  Dvon Jiles D 05/25/2015, 6:34 PM

## 2015-05-27 DIAGNOSIS — K831 Obstruction of bile duct: Secondary | ICD-10-CM

## 2015-05-27 LAB — COMPREHENSIVE METABOLIC PANEL
ALT: 203 U/L — ABNORMAL HIGH (ref 14–54)
AST: 69 U/L — ABNORMAL HIGH (ref 15–41)
Albumin: 2.8 g/dL — ABNORMAL LOW (ref 3.5–5.0)
Alkaline Phosphatase: 319 U/L — ABNORMAL HIGH (ref 38–126)
Anion gap: 8 (ref 5–15)
BUN: 5 mg/dL — ABNORMAL LOW (ref 6–20)
CO2: 24 mmol/L (ref 22–32)
Calcium: 9 mg/dL (ref 8.9–10.3)
Chloride: 107 mmol/L (ref 101–111)
Creatinine, Ser: 0.62 mg/dL (ref 0.44–1.00)
GFR calc Af Amer: >60 mL/min (ref >60)
GFR calc non Af Amer: >60 mL/min (ref >60)
Glucose, Bld: 99 mg/dL (ref 65–99)
Potassium: 3.6 mmol/L (ref 3.5–5.1)
Sodium: 139 mmol/L (ref 135–145)
Total Bilirubin: 1.5 mg/dL — ABNORMAL HIGH (ref 0.3–1.2)
Total Protein: 6.7 g/dL (ref 6.5–8.1)

## 2015-05-27 LAB — CBC WITH DIFFERENTIAL/PLATELET
Basophils Absolute: 0 10*3/uL (ref 0.0–0.1)
Basophils Relative: 0 %
Eosinophils Absolute: 0 10*3/uL (ref 0.0–0.7)
Eosinophils Relative: 0 %
HCT: 34.1 % — ABNORMAL LOW (ref 36.0–46.0)
Hemoglobin: 11.4 g/dL — ABNORMAL LOW (ref 12.0–15.0)
Lymphocytes Relative: 11 %
Lymphs Abs: 1 10*3/uL (ref 0.7–4.0)
MCH: 30.6 pg (ref 26.0–34.0)
MCHC: 33.4 g/dL (ref 30.0–36.0)
MCV: 91.7 fL (ref 78.0–100.0)
Monocytes Absolute: 0.9 10*3/uL (ref 0.1–1.0)
Monocytes Relative: 10 %
Neutro Abs: 7.4 10*3/uL (ref 1.7–7.7)
Neutrophils Relative %: 79 %
Platelets: 244 10*3/uL (ref 150–400)
RBC: 3.72 MIL/uL — ABNORMAL LOW (ref 3.87–5.11)
RDW: 13.8 % (ref 11.5–15.5)
WBC: 9.3 10*3/uL (ref 4.0–10.5)

## 2015-05-27 LAB — LIPASE, BLOOD: Lipase: 16 U/L (ref 11–51)

## 2015-05-27 MED ORDER — CIPROFLOXACIN HCL 500 MG PO TABS: 500.0000 mg | ORAL_TABLET | Freq: Two times a day (BID) | ORAL | Status: AC

## 2015-05-27 NOTE — Discharge Summary (Signed)
Physician Discharge Summary  Phiona Ramnauth ZOX:[id number] DOB: Nov 23, 1975 DOA: 05/25/2015  PCP: Nicki Reaper, NP  Admit date: 05/25/2015 Discharge date: 05/27/2015  Time spent: 65 minutes  Recommendations for Outpatient Follow-up:  1. Follow up with Nicki Reaper, NP in 2-3 weeks. On follow up patient will need a CMET to follow up on LFTs, electrolytes and renal function. 2. Follow up with Dr Elnoria Howard, Gastroenterology in 1 month.   Discharge Diagnoses:  Principal Problem:   Choledocholithiasis with obstruction Active Problems:   Transaminitis   Elevated LFTs   RUQ pain   Dilated bile duct   Common bile duct (CBD) obstruction   RUQ abdominal pain   Esophageal reflux   Discharge Condition: stable and improved  Diet recommendation: Regular  Filed Weights   05/25/15 0946 05/25/15 1956 05/26/15 0705  Weight: 99.338 kg (219 lb) 99.7 kg (219 lb 12.8 oz) 99.338 kg (219 lb)    History of present illness:  This is a 40 year old female with a PMH of cholecystectomy in 2007 admitted with complaints of RUQ pain, epigastric pain, and right flank pain. She stated that she has had this pain started a couple of months ago. It has been intermittent. Bloodwork was performed in 04/24/2015 and her liver enzymes were normal. On 05/16/2015 she was evaluated in [place] PCP to establish a primary care. The thought was that she had GERD and an ultrasound was offered, but she wanted to try omeprazole first. Further work up in the ER with a CT scan reveals markedly dilated intra/extrahepatic biliary ducts and a distal smooth CBD stricture. No issues with ETOH and the pancreas was normal on the CT scan. Her liver enzymes were markedly elevated: AST 409, ALT 516, AP 475, TB 4.6.   Hospital Course:  #1 choledocholithiasis with obstruction Patient had presented with epigastric and right upper quadrant pain. Bloodwork noted that patient had a transaminitis. CT scan showed markedly dilated  intra-/extrahepatic biliary ducts and the distal smooth CBD stricture. Patient was admitted and placed empirically on IV ciprofloxacin and gastroenterology consulted. Patient was seen in consultation by Dr. Elnoria Howard, and patient subsequently underwent ERCP with removal of calculus/calculi on 05/26/2015. Patient tolerated procedure well without any complications. Patient was subsequently placed on a diet and diet advanced to a regular diet which she tolerated. Patient improved symptomatically and LFTs trended down. Patient be discharged home on 6 more days of oral ciprofloxacin to complete a one-week course of antibiotic therapy and is to follow-up with gastroenterology in one month. Patient will be discharged in stable and improved condition.   #2 transaminitis Secondary to No. 1. Patient status post ERCP with removal of calculus/calculi. Patient's LFTs trended down. Outpatient follow up.  #3 gastroesophageal reflux disease Maintained on a PPI.  Procedures:  CT abdomen and pelvis 05/25/2015  ERCP with removal of calculus/calculi. Dr. Elnoria Howard 05/26/2015    Consultations:  Gastroenterology: Dr Elnoria Howard 05/25/2015  Discharge Exam: Filed Vitals:   05/26/15 2129 05/27/15 0556  BP: 128/66 144/80  Pulse: 92 92  Temp: 97.7 F (36.5 C) 98.2 F (36.8 C)  Resp: 19 19    General: NAD Cardiovascular: RRR Respiratory: CTAB Abdomen/GI: Soft/NT/ND/+BS  Discharge Instructions   Discharge Instructions    Diet general    Complete by:  As directed      Discharge instructions    Complete by:  As directed   Follow up with BAITY, REGINA, NP in 2-3 weeks. Follow up with Dr Elnoria Howard in 1 month.     Increase  activity slowly    Complete by:  As directed           Current Discharge Medication List    START taking these medications   Details  ciprofloxacin (CIPRO) 500 MG tablet Take 1 tablet (500 mg total) by mouth 2 (two) times daily. Take for 6 days then stop. Qty: 12 tablet, Refills: 0      CONTINUE  these medications which have NOT CHANGED   Details  omeprazole (PRILOSEC) 20 MG capsule Take 20 mg by mouth daily.       No Known Allergies Follow-up Information    Follow up with Nicki Reaper, NP. Schedule an appointment as soon as possible for a visit in 2 weeks.   Specialty:  Internal Medicine   Why:  f/u in 2-3 weeks.   Contact information:   [address] [place] [address] [phone]       Follow up with Theda Belfast, MD. Schedule an appointment as soon as possible for a visit in 1 month.   Specialty:  Gastroenterology   Why:  office will call with appointment.   Contact information:   [address] [place] [address] [phone]        The results of significant diagnostics from this hospitalization (including imaging, microbiology, ancillary and laboratory) are listed below for reference.    Significant Diagnostic Studies: Ct Abdomen Pelvis W Contrast  05/25/2015  CLINICAL DATA:  Abdominal pain and vomiting. Elevated liver function tests. Prior cholecystectomy. EXAM: CT ABDOMEN AND PELVIS WITH CONTRAST TECHNIQUE: Multidetector CT imaging of the abdomen and pelvis was performed using the standard protocol following bolus administration of intravenous contrast. CONTRAST:  OMNIPAQUE IOHEXOL 300 MG/ML  SOLN COMPARISON:  None. FINDINGS: Lower chest:  No acute findings. Hepatobiliary: No liver masses identified. Prior cholecystectomy noted. Severe diffuse intra and extrahepatic biliary ductal dilatation is seen. Common bile duct measures 17 mm in diameter. Smooth tapering of the distal common bile duct is seen in the pancreatic head. No calcified common duct stones seen. Pancreas: No evidence of pancreatic mass or pancreatic ductal dilatation. No evidence of peripancreatic inflammatory changes or fluid collections. No evidence of pancreatic calcifications. Spleen: Within normal limits in size and appearance. Adrenals/Urinary Tract: No masses  identified. No evidence of hydronephrosis. Stomach/Bowel: No evidence of obstruction, inflammatory process, or abnormal fluid collections. Vascular/Lymphatic: No pathologically enlarged lymph nodes. No evidence of abdominal aortic aneurysm. Reproductive: No mass or other significant abnormality. Other: None. Musculoskeletal:  No suspicious bone lesions identified. IMPRESSION: Prior cholecystectomy. 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 No definite choledocholithiasis or pancreatic mass visualized by CT. Consider a abdomen MRI and MRCP without and with contrast for further evaluation. Electronically Signed   By: Myles Rosenthal M.D.   On: 05/25/2015 14:59   Dg Ercp Biliary & Pancreatic Ducts  05/26/2015  CLINICAL DATA:  Biliary obstruction and choledocholithiasis. EXAM: ERCP TECHNIQUE: Multiple spot images obtained with the fluoroscopic device and submitted for interpretation post-procedure. COMPARISON:  CT of the abdomen on 05/25/2015. FINDINGS: Imaging obtained with a C-arm demonstrates cannulation of the common bile duct with contrast injection showing a diffusely dilated common bile duct and diffuse dilatation of intrahepatic ducts. The gallbladder has been removed. Balloon extraction of calculi was performed. Completion cholangiogram shows no discrete filling defects in the opacified ducts. IMPRESSION: Imaging during ERCP shows diffuse biliary dilatation. After balloon extraction of calculi, completion cholangiogram shows no discrete  filling defects. These images were submitted for radiologic interpretation only. Please see the procedural report for the amount of contrast and the fluoroscopy time utilized. Electronically Signed   By: Irish Lack M.D.   On: 05/26/2015 11:19    Microbiology: No results found for this or any previous visit (from the past 240 hour(s)).   Labs: Basic Metabolic Panel:  Recent Labs Lab  05/25/15 1128 05/26/15 1047 05/27/15 0454  NA 135 138 139  K 4.0 3.6 3.6  CL 98* 103 107  CO2 GLUCOSE 147* 117* 99  BUN <5* <5* 5*  CREATININE 0.69 0.75 0.62  CALCIUM 9.8 9.1 9.0   Liver Function Tests:  Recent Labs Lab 05/25/15 1128 05/26/15 1047 05/27/15 0454  AST 409* 134* 69*  ALT 516* 300* 203*  ALKPHOS 475* 392* 319*  BILITOT 4.6* 6.1* 1.5*  PROT 8.1 7.1 6.7  ALBUMIN 4.1 3.2* 2.8*    Recent Labs Lab 05/25/15 1128 05/26/15 1047 05/27/15 0454  LIPASE No results for input(s): AMMONIA in the last 168 hours. CBC:  Recent Labs Lab 05/25/15 1128 05/26/15 1047 05/27/15 0454  WBC 16.8* 12.4* 9.3  NEUTROABS  --   --  7.4  HGB 14.4 12.7 11.4*  HCT 41.5 38.1 34.1*  MCV 90.4 90.5 91.7  PLT 286 251 244   Cardiac Enzymes: No results for input(s): CKTOTAL, CKMB, CKMBINDEX, TROPONINI in the last 168 hours. BNP: BNP (last 3 results) No results for input(s): BNP in the last 8760 hours.  ProBNP (last 3 results) No results for input(s): PROBNP in the last 8760 hours.  CBG: No results for input(s): GLUCAP in the last 168 hours.     SignedRamiro Harvest MD.  [facility] 05/27/2015, 11:29 AM

## 2015-05-27 NOTE — Progress Notes (Signed)
Subjective: No complaints.  Feeling well.  Objective: Vital signs in last 24 hours: Temp:  [97.7 F (36.5 C)-99.5 F (37.5 C)] 98.2 F (36.8 C) (01/17 0556) Pulse Rate:  [92-117] 92 (01/17 0556) Resp:  [16-19] 19 (01/17 0556) BP: (128-144)/(66-86) 144/80 mmHg (01/17 0556) SpO2:  [100 %] 100 % (01/17 0556) Last BM Date: 05/24/15  Intake/Output from previous day: 01/16 0701 - 01/17 0700 In: 3756.3 [P.O.:1320; I.V.:2236.3; IV Piggyback:200] Out: 2675 [Urine:2675] Intake/Output this shift:    General appearance: alert and no distress GI: soft, non-tender; bowel sounds normal; no masses,  no organomegaly  Lab Results:  Recent Labs  05/25/15 1128 05/26/15 1047 05/27/15 0454  WBC 16.8* 12.4* 9.3  HGB 14.4 12.7 11.4*  HCT 41.5 38.1 34.1*  PLT 286 251 244   BMET  Recent Labs  05/25/15 1128 05/26/15 1047 05/27/15 0454  NA 135 138 139  K 4.0 3.6 3.6  CL 98* 103 107  CO2 GLUCOSE 147* 117* 99  BUN <5* <5* 5*  CREATININE 0.69 0.75 0.62  CALCIUM 9.8 9.1 9.0   LFT  Recent Labs  05/27/15 0454  PROT 6.7  ALBUMIN 2.8*  AST 69*  ALT 203*  ALKPHOS 319*  BILITOT 1.5*   PT/INR No results for input(s): LABPROT, INR in the last 72 hours. Hepatitis Panel No results for input(s): HEPBSAG, HCVAB, HEPAIGM, HEPBIGM in the last 72 hours. C-Diff No results for input(s): CDIFFTOX in the last 72 hours. Fecal Lactopherrin No results for input(s): FECLLACTOFRN in the last 72 hours.  Studies/Results: Ct Abdomen Pelvis W Contrast  05/25/2015  CLINICAL DATA:  Abdominal pain and vomiting. Elevated liver function tests. Prior cholecystectomy. EXAM: CT ABDOMEN AND PELVIS WITH CONTRAST TECHNIQUE: Multidetector CT imaging of the abdomen and pelvis was performed using the standard protocol following bolus administration of intravenous contrast. CONTRAST:  OMNIPAQUE IOHEXOL 300 MG/ML  SOLN COMPARISON:  None. FINDINGS: Lower chest:  No acute findings. Hepatobiliary: No  liver masses identified. Prior cholecystectomy noted. Severe diffuse intra and extrahepatic biliary ductal dilatation is seen. Common bile duct measures 17 mm in diameter. Smooth tapering of the distal common bile duct is seen in the pancreatic head. No calcified common duct stones seen. Pancreas: No evidence of pancreatic mass or pancreatic ductal dilatation. No evidence of peripancreatic inflammatory changes or fluid collections. No evidence of pancreatic calcifications. Spleen: Within normal limits in size and appearance. Adrenals/Urinary Tract: No masses identified. No evidence of hydronephrosis. Stomach/Bowel: No evidence of obstruction, inflammatory process, or abnormal fluid collections. Vascular/Lymphatic: No pathologically enlarged lymph nodes. No evidence of abdominal aortic aneurysm. Reproductive: No mass or other significant abnormality. Other: None. Musculoskeletal:  No suspicious bone lesions identified. IMPRESSION: Prior cholecystectomy. 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 No definite choledocholithiasis or pancreatic mass visualized by CT. Consider a abdomen MRI and MRCP without and with contrast for further evaluation. Electronically Signed   By: Myles Rosenthal M.D.   On: 05/25/2015 14:59   Dg Ercp Biliary & Pancreatic Ducts  05/26/2015  CLINICAL DATA:  Biliary obstruction and choledocholithiasis. EXAM: ERCP TECHNIQUE: Multiple spot images obtained with the fluoroscopic device and submitted for interpretation post-procedure. COMPARISON:  CT of the abdomen on 05/25/2015. FINDINGS: Imaging obtained with a C-arm demonstrates cannulation of the common bile duct with contrast injection showing a diffusely dilated common bile duct and diffuse dilatation of intrahepatic ducts. The gallbladder has been removed. Balloon extraction of calculi  was performed. Completion cholangiogram shows no discrete filling defects in the  opacified ducts. IMPRESSION: Imaging during ERCP shows diffuse biliary dilatation. After balloon extraction of calculi, completion cholangiogram shows no discrete filling defects. These images were submitted for radiologic interpretation only. Please see the procedural report for the amount of contrast and the fluoroscopy time utilized. Electronically Signed   By: Irish Lack M.D.   On: 05/26/2015 11:19    Medications:  Scheduled: . ciprofloxacin  400 mg Intravenous Q24H  . heparin  5,000 Units Subcutaneous 3 times per day   Continuous: . sodium chloride 75 mL/hr at 05/26/15 1912    Assessment/Plan: 1) Choledocholithiasis s/p successful stone extraction. 2) Abnormal liver enzymes secondary to #1.   She is well.  No complications from the ERCP.  There was no evidence of a biliary stricture during the ERCP and liver enzymes as well as TB have improved.  Plan: 1) Okay to D/C home. 2) Follow up in the office in one month.  I will make the arrangements.  LOS: 2 days   Trayshawn Durkin D 05/27/2015, 7:42 AM

## 2015-05-27 NOTE — Anesthesia Postprocedure Evaluation (Signed)
Anesthesia Post Note  Patient: Brianna Schneider  Procedure(s) Performed: Procedure(s) (LRB): ENDOSCOPIC RETROGRADE CHOLANGIOPANCREATOGRAPHY (ERCP) (N/A)  Patient location during evaluation: PACU Anesthesia Type: General Level of consciousness: awake and alert Pain management: pain level controlled Vital Signs Assessment: post-procedure vital signs reviewed and stable Respiratory status: spontaneous breathing, nonlabored ventilation, respiratory function stable and patient connected to nasal cannula oxygen Cardiovascular status: blood pressure returned to baseline and stable Postop Assessment: no signs of nausea or vomiting Anesthetic complications: no    Last Vitals:  Filed Vitals:   05/26/15 2129 05/27/15 0556  BP: 128/66 144/80  Pulse: 92 92  Temp: 36.5 C 36.8 C  Resp: 19 19    Last Pain:  Filed Vitals:   05/27/15 0557  PainSc: 0-No pain                 Cecile Hearing

## 2015-05-28 ENCOUNTER — Encounter (HOSPITAL_COMMUNITY): Payer: Self-pay | Admitting: Gastroenterology

## 2015-06-09 ENCOUNTER — Ambulatory Visit (INDEPENDENT_AMBULATORY_CARE_PROVIDER_SITE_OTHER): Payer: BC Managed Care – PPO | Admitting: Internal Medicine

## 2015-06-09 ENCOUNTER — Encounter: Payer: Self-pay | Admitting: Internal Medicine

## 2015-06-09 VITALS — BP 120/74 | HR 67 | Temp 98.0°F | Wt 220.5 lb

## 2015-06-09 DIAGNOSIS — K8051 Calculus of bile duct without cholangitis or cholecystitis with obstruction: Secondary | ICD-10-CM | POA: Diagnosis not present

## 2015-06-09 LAB — COMPREHENSIVE METABOLIC PANEL
ALT: 21 U/L (ref 0–35)
AST: 22 U/L (ref 0–37)
Albumin: 4.3 g/dL (ref 3.5–5.2)
Alkaline Phosphatase: 134 U/L — ABNORMAL HIGH (ref 39–117)
BUN: 12 mg/dL (ref 6–23)
CO2: 29 mEq/L (ref 19–32)
Calcium: 9.7 mg/dL (ref 8.4–10.5)
Chloride: 101 mEq/L (ref 96–112)
Creatinine, Ser: 0.71 mg/dL (ref 0.40–1.20)
GFR: 118.2 mL/min (ref >60.00)
Glucose, Bld: 93 mg/dL (ref 70–99)
Potassium: 4 mEq/L (ref 3.5–5.1)
Sodium: 138 mEq/L (ref 135–145)
Total Bilirubin: 1 mg/dL (ref 0.2–1.2)
Total Protein: 8 g/dL (ref 6.0–8.3)

## 2015-06-09 LAB — CBC
HCT: 39.5 % (ref 36.0–46.0)
Hemoglobin: 13.2 g/dL (ref 12.0–15.0)
MCHC: 33.5 g/dL (ref 30.0–36.0)
MCV: 90.7 fl (ref 78.0–100.0)
Platelets: 264 10*3/uL (ref 150.0–400.0)
RBC: 4.36 Mil/uL (ref 3.87–5.11)
RDW: 14 % (ref 11.5–15.5)
WBC: 10.5 10*3/uL (ref 4.0–10.5)

## 2015-06-09 NOTE — Progress Notes (Signed)
Pre visit review using our clinic review tool, if applicable. No additional management support is needed unless otherwise documented below in the visit note. 

## 2015-06-09 NOTE — Patient Instructions (Signed)
Bilirubin Test °WHY AM I HAVING THIS TEST? °The bilirubin test is used to evaluate liver function. Your health care provider may recommend this test if you have hemolytic anemia. The test may also be done for a newborn who has jaundice. °Bilirubin is produced when red blood cells are broken down. Normally, bilirubin is broken down in the liver and excreted as a component of bile. However, when red blood cells are broken down more quickly than usual, or when there is a dysfunction in how bile is excreted, bilirubin levels can become elevated. In newborns with jaundice, elevated bilirubin levels may put the child at risk for brain damage. °WHAT KIND OF SAMPLE IS TAKEN? °This test can be performed using one of the following methods: °· Blood sample. This is usually collected by inserting a needle into a vein. °· Urine sample. This is collected using a sterile container that is given to you by the lab. °HOW DO I PREPARE FOR THE TEST? °Fasting requirements for this test may vary among different labs. You may be asked not to eat or drink anything except water after midnight on the night before the test. °WHAT ARE THE REFERENCE RANGES? °Reference ranges are considered healthy ranges established after testing a large group of healthy people. Reference ranges may vary among different people, labs, and hospitals. It is your responsibility to obtain your test results. Ask the lab or department performing the test when and how you will get your results.  °Reference ranges for blood samples: °· Adult, child, or elderly: °¨ Total bilirubin: 0.3-1 mg/dL or 5.1-17 micromole/liter (SI units). °¨ Indirect bilirubin: 0.2-0.8 mg/dL or 3.4-12 micromole/liter (SI units). °¨ Direct bilirubin: 0.1-0.3 mg/dL or 1.7-5.1 micromole/liter (SI units). °· Newborn total bilirubin: 1-12 mg/dL or 17.1-205 micromole/liter (SI units). °Reference range for urine samples: °0-0.02 mg/dL.  °WHAT DO THE RESULTS MEAN? °Levels greater than the reference  ranges may indicate: °· Gallstones. °· Obstruction of the bile ducts. °· Certain tumors of the liver. °· Disorders that affect the breakdown and excretion of bilirubin. °· Disorders that cause the destruction of red blood cells. °· Liver diseases. °· Reaction to certain medicines. °· Reaction to blood transfusion. °Talk with your health care provider to discuss your results, treatment options, and if necessary, the need for more tests. Talk with your health care provider if you have any questions about your results. °  °This information is not intended to replace advice given to you by your health care provider. Make sure you discuss any questions you have with your health care provider. °  °Document Released: 05/18/2004 Document Revised: 05/17/2014 Document Reviewed: 09/24/2013 °Elsevier Interactive Patient Education ©2016 [employer] ° °

## 2015-06-09 NOTE — Progress Notes (Signed)
Subjective:    Patient ID: Brianna Schneider, female    DOB: 16-Dec-1975, 40 y.o.   MRN: [id number]  HPI  Pt presents today for hospital follow up. She visited the emergency department on 05/25/15 and was admitted to the hospital after an abdominal CT showed choledocholithiasis with obstruction. Liver enzymes were markedly elevated. Pancreas was normal on the CT scan. Pt had cholecystectomy in 2007. Pt was placed empirically on IV ciprofloxacin. She underwent ERCP with removal of calculus/calculi on 05/26/15. No surgical complications and has now advanced to a regular diet. Pt improved symptomatically. Pt was discharged on Ciprofloxacin  PO BID. Course of antibiotics was completed.   Transaminitis secondary to choledocholithiasis. LFTs trended down.   GERD maintained on Omeprazole. No complaints of heartburn lately.   Pt was discharged on 05/27/15. Pt instructed to increase activity slowly, she says she has been able to go back to normal activity. Follow up with gastroenterology one month after discharge. Gastroenterologist said they will call to schedule the appointment.  Review of Systems  Past Medical History  Diagnosis Date  . Anxiety   . GERD (gastroesophageal reflux disease)     Current Outpatient Prescriptions  Medication Sig Dispense Refill  . omeprazole (PRILOSEC) 20 MG capsule Take 20 mg by mouth daily.     No current facility-administered medications for this visit.    No Known Allergies  Family History  Problem Relation Age of Onset  . Hypertension Mother   . Arthritis Maternal Grandmother   . Breast cancer Maternal Grandmother     Social History   Social History  . Marital Status: Married    Spouse Name: N/A  . Number of Children: N/A  . Years of Education: N/A   Occupational History  . Not on file.   Social History Main Topics  . Smoking status: Never Smoker   . Smokeless tobacco: Never Used  . Alcohol Use: 0.0 oz/week    0 Standard drinks or  equivalent per week     Comment: occasional  . Drug Use: No  . Sexual Activity: Yes    Birth Control/ Protection: None   Other Topics Concern  . Not on file   Social History Narrative     Constitutional: Denies fever, fatigue, or headache.  Respiratory: Denies difficulty breathing or shortness of breath. Cardiovascular: Denies chest pain, or chest tightness. Gastrointestinal: Denies abdominal pain, bloating, constipation, diarrhea or blood in the stool.  GU: Denies urgency, frequency or pain with urination.   No other specific complaints in a complete review of systems (except as listed in HPI above).     Objective:   Physical Exam BP 120/74 mmHg  Pulse 67  Temp(Src) 98 F (36.7 C) (Oral)  Wt 220 lb 8 oz (100.018 kg)  SpO2 98%  LMP 05/17/2015 Wt Readings from Last 3 Encounters:  06/09/15 220 lb 8 oz (100.018 kg)  05/26/15 219 lb (99.338 kg)  05/16/15 219 lb 12.8 oz (99.701 kg)    General: Appears her stated age, obese in NAD. Skin: Warm, dry and intact. No rashes, lesions or ulcerations noted. Cardiovascular: Normal rate and rhythm. S1,S2 noted.  No murmur, rubs or gallops noted.  Pulmonary/Chest: Normal effort and positive vesicular breath sounds. No respiratory distress. No wheezes, rales or ronchi noted.  Abdomen: Soft and nontender. Normal bowel sounds. No distention or masses noted. Liver, spleen and kidneys non palpable.    BMET    Component Value Date/Time   NA 139 05/27/2015 0454   K  3.6 05/27/2015 0454   CL 107 05/27/2015 0454   CO2 24 05/27/2015 0454   GLUCOSE 99 05/27/2015 0454   BUN 5* 05/27/2015 0454   CREATININE 0.62 05/27/2015 0454   CALCIUM 9.0 05/27/2015 0454   GFRNONAA >60 05/27/2015 0454   GFRAA >60 05/27/2015 0454    Lipid Panel  No results found for: CHOL, TRIG, HDL, CHOLHDL, VLDL, LDLCALC  CBC    Component Value Date/Time   WBC 9.3 05/27/2015 0454   RBC 3.72* 05/27/2015 0454   HGB 11.4* 05/27/2015 0454   HCT 34.1* 05/27/2015  0454   PLT 244 05/27/2015 0454   MCV 91.7 05/27/2015 0454   MCH 30.6 05/27/2015 0454   MCHC 33.4 05/27/2015 0454   RDW 13.8 05/27/2015 0454   LYMPHSABS 1.0 05/27/2015 0454   MONOABS 0.9 05/27/2015 0454   EOSABS 0.0 05/27/2015 0454   BASOSABS 0.0 05/27/2015 0454    Hgb A1C No results found for: HGBA1C        Assessment & Plan:  Hospital Follow Up for Choledocholithiasis:   Hospital notes, labs and imaging reviewed CBC and CMET today  Pt will be called with results  Follow up with gastroenterologist in 2 weeks   GERD:  Continue Omeprazole  RTC as needed or if any complications arise.

## 2015-10-20 ENCOUNTER — Ambulatory Visit (INDEPENDENT_AMBULATORY_CARE_PROVIDER_SITE_OTHER): Payer: BC Managed Care – PPO | Admitting: Internal Medicine

## 2015-10-20 ENCOUNTER — Encounter: Payer: Self-pay | Admitting: Internal Medicine

## 2015-10-20 ENCOUNTER — Ambulatory Visit (INDEPENDENT_AMBULATORY_CARE_PROVIDER_SITE_OTHER)
Admission: RE | Admit: 2015-10-20 | Discharge: 2015-10-20 | Disposition: A | Discharge status: Home / Self Care | Payer: BC Managed Care – PPO | Source: Ambulatory Visit | Attending: Internal Medicine | Admitting: Internal Medicine

## 2015-10-20 VITALS — BP 130/80 | HR 78 | Temp 98.2°F | Wt 231.0 lb

## 2015-10-20 DIAGNOSIS — M25431 Effusion, right wrist: Secondary | ICD-10-CM | POA: Diagnosis not present

## 2015-10-20 DIAGNOSIS — M25531 Pain in right wrist: Secondary | ICD-10-CM | POA: Diagnosis not present

## 2015-10-20 DIAGNOSIS — W07XXXA Fall from chair, initial encounter: Secondary | ICD-10-CM

## 2015-10-20 NOTE — Progress Notes (Signed)
Pre visit review using our clinic review tool, if applicable. No additional management support is needed unless otherwise documented below in the visit note. 

## 2015-10-20 NOTE — Patient Instructions (Signed)
Elastic Bandage and RICE °WHAT DOES AN ELASTIC BANDAGE DO? °Elastic bandages come in different shapes and sizes. They generally provide support to your injury and reduce swelling while you are healing, but they can perform different functions. Your health care provider will help you to decide what is best for your protection, recovery, or rehabilitation following an injury. °WHAT ARE SOME GENERAL TIPS FOR USING AN ELASTIC BANDAGE? °· Use the bandage as directed by the maker of the bandage that you are using. °· Do not wrap the bandage too tightly. This may cut off the circulation in the arm or leg in the area below the bandage. °¨ If part of your body beyond the bandage becomes blue, numb, cold, swollen, or is more painful, your bandage is most likely too tight. If this occurs, remove your bandage and reapply it more loosely. °· See your health care provider if the bandage seems to be making your problems worse rather than better. °· An elastic bandage should be removed and reapplied every 3-4 hours or as directed by your health care provider. °WHAT IS RICE? °The routine care of many injuries includes rest, ice, compression, and elevation (RICE therapy).  °Rest °Rest is required to allow your body to heal. Generally, you can resume your routine activities when you are comfortable and have been given permission by your health care provider. °Ice °Icing your injury helps to keep the swelling down and it reduces pain. Do not apply ice directly to your skin. °· Put ice in a plastic bag. °· Place a towel between your skin and the bag. °· Leave the ice on for 20 minutes, 2-3 times per day. °Do this for as long as you are directed by your health care provider. °Compression °Compression helps to keep swelling down, gives support, and helps with discomfort. Compression may be done with an elastic bandage. °Elevation °Elevation helps to reduce swelling and it decreases pain. If possible, your injured area should be placed at  or above the level of your heart or the center of your chest. °WHEN SHOULD I SEEK MEDICAL CARE? °You should seek medical care if: °· You have persistent pain and swelling. °· Your symptoms are getting worse rather than improving. °These symptoms may indicate that further evaluation or further X-rays are needed. Sometimes, X-rays may not show a small broken bone (fracture) until a number of days later. Make a follow-up appointment with your health care provider. Ask when your X-ray results will be ready. Make sure that you get your X-ray results. °WHEN SHOULD I SEEK IMMEDIATE MEDICAL CARE? °You should seek immediate medical care if: °· You have a sudden onset of severe pain at or below the area of your injury. °· You develop redness or increased swelling around your injury. °· You have tingling or numbness at or below the area of your injury that does not improve after you remove the elastic bandage. °  °This information is not intended to replace advice given to you by your health care provider. Make sure you discuss any questions you have with your health care provider. °  °Document Released: 10/16/2001 Document Revised: 01/15/2015 Document Reviewed: 12/10/2013 °Elsevier Interactive Patient Education ©2016 [employer] Inc. ° °

## 2015-10-20 NOTE — Progress Notes (Signed)
Subjective:    Patient ID: Brianna Schneider, female    DOB: [birth year], 40 y.o.   MRN: [id number]  HPI  Pt presents to the clinic today with c/o right wrist pain and swelling. This started yesterday after she fell out of her chair, while trying to pick something up off the floor. She caught herself with her right hand. She woke up this morning with pain in her right wrist. She describes the pain as throbbing. She denies bruising redness, numbness or tingling. She has tried ice with some relief.  Review of Systems      Past Medical History  Diagnosis Date  . Anxiety   . GERD (gastroesophageal reflux disease)     No current outpatient prescriptions on file.   No current facility-administered medications for this visit.    No Known Allergies  Family History  Problem Relation Age of Onset  . Hypertension Mother   . Arthritis Maternal Grandmother   . Breast cancer Maternal Grandmother     Social History   Social History  . Marital Status: Married    Spouse Name: N/A  . Number of Children: N/A  . Years of Education: N/A   Occupational History  . Not on file.   Social History Main Topics  . Smoking status: Never Smoker   . Smokeless tobacco: Never Used  . Alcohol Use: 0.0 oz/week    0 Standard drinks or equivalent per week     Comment: occasional  . Drug Use: No  . Sexual Activity: Yes    Birth Control/ Protection: None   Other Topics Concern  . Not on file   Social History Narrative     Constitutional: Denies fever, malaise, fatigue, headache or abrupt weight changes.  Musculoskeletal: Pt reports right wrist pain and swelling. Denies difficulty with gait, muscle pain.  Skin: Denies redness, rashes, lesions or ulcercations.    No other specific complaints in a complete review of systems (except as listed in HPI above).  Objective:   Physical Exam  BP 130/80 mmHg  Pulse 78  Temp(Src) 98.2 F (36.8 C) (Oral)  Wt 231 lb (104.781 kg)  SpO2 99% Wt  Readings from Last 3 Encounters:  10/20/15 231 lb (104.781 kg)  06/09/15 220 lb 8 oz (100.018 kg)  05/26/15 219 lb (99.338 kg)    General: Appears her stated age, obese in NAD. Skin: Warm, dry and intact. No bruising noted. Cardiovascular: Radial pulse 2+ bilaterally. Musculoskeletal: Decreased flexion, extension and rotation of right wrist due to pain. Mild swelling of the right wrist. Pain with palpation of the distal ulna. Hand grips slightly unequal, L>R. Neurological: Alert and oriented. Sensation intact to BUE.   BMET    Component Value Date/Time   NA 138 06/09/2015 1356   K 4.0 06/09/2015 1356   CL 101 06/09/2015 1356   CO2 29 06/09/2015 1356   GLUCOSE 93 06/09/2015 1356   BUN 12 06/09/2015 1356   CREATININE 0.71 06/09/2015 1356   CALCIUM 9.7 06/09/2015 1356   GFRNONAA >60 05/27/2015 0454   GFRAA >60 05/27/2015 0454    Lipid Panel  No results found for: CHOL, TRIG, HDL, CHOLHDL, VLDL, LDLCALC  CBC    Component Value Date/Time   WBC 10.5 06/09/2015 1356   RBC 4.36 06/09/2015 1356   HGB 13.2 06/09/2015 1356   HCT 39.5 06/09/2015 1356   PLT 264.0 06/09/2015 1356   MCV 90.7 06/09/2015 1356   MCH 30.6 05/27/2015 0454   MCHC 33.5 06/09/2015 1356  RDW 14.0 06/09/2015 1356   LYMPHSABS 1.0 05/27/2015 0454   MONOABS 0.9 05/27/2015 0454   EOSABS 0.0 05/27/2015 0454   BASOSABS 0.0 05/27/2015 0454    Hgb A1C No results found for: HGBA1C       Assessment & Plan:   Right wrist pain and swelling, s/p fall:  Xray right wrist to r/o fracture Right wrist wrapped in ACE wrap Continue ice for 10 minutes 2 x day Ibuprofen 400 mg TID prn for pain and inflammation Work note provided to be excused today and tomorrow  Will follow up after xray, RTC as needed

## 2015-12-30 ENCOUNTER — Telehealth: Payer: Self-pay

## 2015-12-30 NOTE — Telephone Encounter (Signed)
She told me at that time, that Celexa made her drowsy. There may be better options. See if she wants to make a follow up to discuss going back on meds and different options.

## 2015-12-30 NOTE — Telephone Encounter (Signed)
Pt left vm; pt established care on 04/24/15 and at that visit pt was not taking citalopram;R Baity NP has not filled before. pt left v/m requesting citalopram to [facility] [place] [facility].Please advise.

## 2016-01-01 NOTE — Telephone Encounter (Signed)
Left detailed msg on VM per HIPAA  

## 2016-02-02 ENCOUNTER — Encounter: Payer: Self-pay | Admitting: Primary Care

## 2016-02-02 ENCOUNTER — Telehealth: Payer: Self-pay

## 2016-02-02 ENCOUNTER — Ambulatory Visit (INDEPENDENT_AMBULATORY_CARE_PROVIDER_SITE_OTHER): Payer: BC Managed Care – PPO | Admitting: Primary Care

## 2016-02-02 ENCOUNTER — Ambulatory Visit (INDEPENDENT_AMBULATORY_CARE_PROVIDER_SITE_OTHER)
Admission: RE | Admit: 2016-02-02 | Discharge: 2016-02-02 | Disposition: A | Discharge status: Home / Self Care | Payer: BC Managed Care – PPO | Source: Ambulatory Visit | Attending: Primary Care | Admitting: Primary Care

## 2016-02-02 VITALS — BP 134/84 | HR 85 | Temp 97.5°F | Ht 62.0 in | Wt 241.0 lb

## 2016-02-02 DIAGNOSIS — F411 Generalized anxiety disorder: Secondary | ICD-10-CM

## 2016-02-02 DIAGNOSIS — M25561 Pain in right knee: Secondary | ICD-10-CM

## 2016-02-02 MED ORDER — CITALOPRAM HYDROBROMIDE 10 MG PO TABS: 10.0000 mg | ORAL_TABLET | Freq: Every day | ORAL | 0 refills | Status: DC

## 2016-02-02 NOTE — Progress Notes (Signed)
Pre visit review using our clinic review tool, if applicable. No additional management support is needed unless otherwise documented below in the visit note. 

## 2016-02-02 NOTE — Telephone Encounter (Signed)
PLEASE NOTE: All timestamps contained within this report are represented as [place] Standard Time. CONFIDENTIALTY NOTICE: This fax transmission is intended only for the addressee. It contains information that is legally privileged, confidential or otherwise protected from use or disclosure. If you are not the intended recipient, you are strictly prohibited from reviewing, disclosing, copying using or disseminating any of this information or taking any action in reliance on or regarding this information. If you have received this fax in error, please notify us immediately by telephone so that we can arrange for its return to [place]. Phone: [phone], Toll-Free: [phone], Fax: [phone] Page: 1 of 2 Call Id: [id number] [facility] Night - Client TELEPHONE ADVICE RECORD [facility] Patient Name: Brianna Schneider Gender: Female DOB: November 04, 1975 Age: 40 Y 1 M 6 D Return Phone Number: [phone] (Primary) Address: City/State/Zip: [place] Client [facility] Night - Client Client Site  [facility] [place] - Night Physician Nicki Reaper - NP Contact Type Call Who Is Calling Patient / Member / Family / Caregiver Call Type Triage / Clinical Relationship To Patient Self Return Phone Number [phone] (Primary) Chief Complaint Leg Pain Reason for Call Symptomatic / Request for Health Information Initial Comment Caller states she fell down the stairs and her leg is swelling. PreDisposition Did not know what to do Translation No Nurse Assessment Nurse: Odis Luster, RN, Bjorn Loser Date/Time (Eastern Time): 02/01/2016 5:30:00 PM Confirm and document reason for call. If symptomatic, describe symptoms. You must click the next button to save text entered. ---Caller states she fell down the stairs and her knee is swelling. She fell yesterday. She has been icing it and it is getting more swollen. Has the patient traveled out of the  country within the last 30 days? ---Not Applicable Does the patient have any new or worsening symptoms? ---Yes Will a triage be completed? ---Yes Related visit to physician within the last 2 weeks? ---N/A Does the PT have any chronic conditions? (i.e. diabetes, asthma, etc.) ---No Is the patient pregnant or possibly pregnant? (Ask all females between the ages of 3-55) ---No Is this a behavioral health or substance abuse call? ---No Guidelines Guideline Title Affirmed Question Affirmed Notes Nurse Date/Time Lamount Cohen Time) Knee Injury Large swelling or bruise (> 2 inches or 5 cm) Odis Luster, RN, Rhonda 02/01/2016 5:32:22 PM Disp. Time Lamount Cohen Time) Disposition Final User 02/01/2016 5:36:34 PM See Physician within 24 Hours Yes Odis Luster, RN, Bjorn Loser PLEASE NOTE: All timestamps contained within this report are represented as [place] Standard Time. CONFIDENTIALTY NOTICE: This fax transmission is intended only for the addressee. It contains information that is legally privileged, confidential or otherwise protected from use or disclosure. If you are not the intended recipient, you are strictly prohibited from reviewing, disclosing, copying using or disseminating any of this information or taking any action in reliance on or regarding this information. If you have received this fax in error, please notify us immediately by telephone so that we can arrange for its return to [place]. Phone: [phone], Toll-Free: [phone], Fax: [phone] Page: 2 of 2 Call Id: [id number] Caller Understands: Yes Disagree/Comply: Comply Care Advice Given Per Guideline SEE PHYSICIAN WITHIN 24 HOURS: * IF OFFICE WILL BE OPEN: You need to be seen within the next 24 hours. Call your doctor when the office opens, and make an appointment. LOCAL COLD: For bruises or swelling, apply a cold pack or an ice bag (wrapped in a moist towel) to the area for 20 minutes per hour.  Repeat for 4 consecutive hours. (Reason: reduce the  bleeding and pain) PAIN MEDICINES: * For pain relief, take acetaminophen, ibuprofen, or naproxen. * Use the lowest amount that makes your pain feel better. NAPROXEN (E.G., ALEVE): * Take 220 mg (one 220 mg pill) by mouth every 8 hours as needed. You may take 440 mg (two 220 mg pills) for your first dose. CAUTION - NSAIDS (E.G., IBUPROFEN, NAPROXEN): * You may take this medicine with or without food. Taking it with food or milk may lessen the chance the drug will upset your stomach. * Do not take NSAID medications for over 7 days without consulting your PCP. CALL BACK IF: * Pain becomes severe * You become worse. CARE ADVICE given per Knee Injury (Adult) guideline. Referrals REFERRED TO PCP OFFICE

## 2016-02-02 NOTE — Telephone Encounter (Signed)
Noted. Patient evaluated this afternoon.

## 2016-02-02 NOTE — Patient Instructions (Signed)
Complete xray(s) prior to leaving today. I will notify you of your results once received.  Continue ibuprofen. You may take 3 tablets by mouth every 8 hours as needed for pain and inflammation.  Purchase a knee brace at the drug store to help stabilize the knee.  Continue application of ice for swelling and discomfort.  It was a pleasure meeting you!

## 2016-02-02 NOTE — Telephone Encounter (Signed)
Left v/m requesting pt to cb.  Pt called back and scheduled appt with Mayra ReelKate Clark NP 02/02/16 at 12:15.

## 2016-02-02 NOTE — Progress Notes (Signed)
Subjective:    Patient ID: Brianna Schneider, female    DOB: August 22, 1975, 40 y.o.   MRN: [id number]  HPI  Brianna Schneider is a 40 year old female who presents  today with a chief complaint of right lower extremity pain. Brianna Schneider was walking down her stairs Saturday this weekend and fell down 6 steps. Her left leg extended forward in front of her and her left leg extended behind her. Brianna Schneider was able to catch herself on the rail before falling down any additional stairs. Her pain is located to the right anterior knee, tibia, and upper femur. The majority of her discomfort is to her right anterior knee. Brianna Schneider denies deformity, changes to the color of her skin, shortening. Her pain is worse during sleep or just after Brianna Schneider has risen from rest. Brianna Schneider's taken ibuprofen for her discomfort with some improvement.  Review of Systems  Musculoskeletal: Positive for gait problem, joint swelling and myalgias. Negative for back pain.       Right knee pain  Skin: Negative for color change and wound.  Neurological: Negative for numbness.       Past Medical History:  Diagnosis Date  . Anxiety   . GERD (gastroesophageal reflux disease)      Social History   Social History  . Marital status: Married    Spouse name: N/A  . Number of children: N/A  . Years of education: N/A   Occupational History  . Not on file.   Social History Main Topics  . Smoking status: Never Smoker  . Smokeless tobacco: Never Used  . Alcohol use 0.0 oz/week     Comment: occasional  . Drug use: No  . Sexual activity: Yes    Birth control/ protection: None   Other Topics Concern  . Not on file   Social History Narrative  . No narrative on file    Past Surgical History:  Procedure Laterality Date  . CHOLECYSTECTOMY  2007  . ERCP N/A 05/26/2015   Procedure: ENDOSCOPIC RETROGRADE CHOLANGIOPANCREATOGRAPHY (ERCP);  Surgeon: Jeani Hawking, MD;  Location: [facility] ENDOSCOPY;  Service: Endoscopy;  Laterality: N/A;  . WISDOM TOOTH EXTRACTION       Family History  Problem Relation Age of Onset  . Hypertension Mother   . Arthritis Maternal Grandmother   . Breast cancer Maternal Grandmother     No Known Allergies  No current outpatient prescriptions on file prior to visit.   No current facility-administered medications on file prior to visit.     BP 134/84   Pulse 85   Temp 97.5 F (36.4 C) (Oral)   Ht 5\' 2"  (1.575 m)   Wt 241 lb (109.3 kg)   LMP 01/04/2016 (Within Days)   SpO2 92%   BMI 44.08 kg/m    Objective:   Physical Exam  Constitutional: Brianna Schneider appears well-nourished.  Cardiovascular: Normal rate and regular rhythm.   Pulmonary/Chest: Effort normal and breath sounds normal.  Musculoskeletal:       Right knee: Brianna Schneider exhibits decreased range of motion, swelling and bony tenderness. Brianna Schneider exhibits no deformity, no erythema and normal alignment. Tenderness found. Medial joint line tenderness noted.  No obvious deformity to right femur or tibia/fibula. Mild tenderness. Most of pain to right knee.  Skin: Skin is warm and dry.          Assessment & Plan:  Knee pain:  Present since fall down several steps Saturday this past weekend. Ambulatory in clinic today with limp. Exam today without obvious deformity  to femur or tib-fib. Moderate swelling with tenderness to right patella. Do not suspect deformity to femur or tib-fib, but will obtain x-ray to right patella given swelling and tenderness to rule out any abnormality. Discussed conservative treatment such as obtaining any brace, ibuprofen 600 mg 3 times daily as needed, application of ice, elevation, rest. X-ray pending.  Morrie Sheldonlark,Camay Pedigo Kendal, NP

## 2016-10-29 ENCOUNTER — Other Ambulatory Visit: Payer: Self-pay | Admitting: *Deleted

## 2016-10-29 DIAGNOSIS — F411 Generalized anxiety disorder: Secondary | ICD-10-CM

## 2016-10-29 NOTE — Telephone Encounter (Signed)
Has she not taken this in the last 8 months?

## 2016-10-29 NOTE — Telephone Encounter (Signed)
Pt requesting medication refill. Last Rx and OV 01/2016 #30.  pls advise

## 2016-11-02 NOTE — Telephone Encounter (Signed)
Left detailed msg on VM per HIPAA  

## 2016-11-03 NOTE — Addendum Note (Signed)
Addended by: Roena MaladyEVONTENNO, MELANIE Y on: 11/03/2016 05:16 PM   Modules accepted: Orders

## 2016-11-03 NOTE — Telephone Encounter (Signed)
Pt reports she had been taking PRN.... I explained to pt that if this medication is taken it needs to be taken daily as it is used prevent anxiety episodes... She expressed understanding and states she will take daily vs PRN... Also I scheduled pt for a CPE

## 2016-11-03 NOTE — Telephone Encounter (Signed)
Pt returned your call about rx  Thanks

## 2016-11-04 MED ORDER — CITALOPRAM HYDROBROMIDE 10 MG PO TABS: 10.0000 mg | ORAL_TABLET | Freq: Every day | ORAL | 0 refills | Status: DC

## 2016-11-30 ENCOUNTER — Telehealth: Payer: Self-pay | Admitting: Internal Medicine

## 2016-11-30 ENCOUNTER — Encounter: Payer: Self-pay | Admitting: Internal Medicine

## 2016-11-30 DIAGNOSIS — Z0289 Encounter for other administrative examinations: Secondary | ICD-10-CM

## 2016-11-30 NOTE — Telephone Encounter (Signed)
Patient did not come in for their appointment today for cpe. Please let me know if patient needs to be contacted immediately for follow up or no follow up needed. Do you want to charge the NSF? °

## 2016-11-30 NOTE — Telephone Encounter (Signed)
Yes, she needs to reschedule, and yes charge the NSF

## 2016-11-30 NOTE — Progress Notes (Deleted)
   Subjective:    Patient ID: Brianna Schneider, female    DOB: 05-06-1976, 41 y.o.   MRN: [id number]  HPI  Pt presents to the clinic today for her annual exam.   Flu: Tetanus: Pap Smear: 09/2014 Dentist:  Diet: Exercise:  Review of Systems  Past Medical History:  Diagnosis Date  . Anxiety   . GERD (gastroesophageal reflux disease)     Current Outpatient Prescriptions  Medication Sig Dispense Refill  . citalopram (CELEXA) 10 MG tablet Take 1 tablet (10 mg total) by mouth daily. 30 tablet 0   No current facility-administered medications for this visit.     No Known Allergies  Family History  Problem Relation Age of Onset  . Hypertension Mother   . Arthritis Maternal Grandmother   . Breast cancer Maternal Grandmother     Social History   Social History  . Marital status: Married    Spouse name: N/A  . Number of children: N/A  . Years of education: N/A   Occupational History  . Not on file.   Social History Main Topics  . Smoking status: Never Smoker  . Smokeless tobacco: Never Used  . Alcohol use 0.0 oz/week     Comment: occasional  . Drug use: No  . Sexual activity: Yes    Birth control/ protection: None   Other Topics Concern  . Not on file   Social History Narrative  . No narrative on file     Constitutional: Denies fever, malaise, fatigue, headache or abrupt weight changes.  HEENT: Denies eye pain, eye redness, ear pain, ringing in the ears, wax buildup, runny nose, nasal congestion, bloody nose, or sore throat. Respiratory: Denies difficulty breathing, shortness of breath, cough or sputum production.   Cardiovascular: Denies chest pain, chest tightness, palpitations or swelling in the hands or feet.  Gastrointestinal: Denies abdominal pain, bloating, constipation, diarrhea or blood in the stool.  GU: Denies urgency, frequency, pain with urination, burning sensation, blood in urine, odor or discharge. Musculoskeletal: Denies decrease in range of  motion, difficulty with gait, muscle pain or joint pain and swelling.  Skin: Denies redness, rashes, lesions or ulcercations.  Neurological: Denies dizziness, difficulty with memory, difficulty with speech or problems with balance and coordination.  Psych: Denies anxiety, depression, SI/HI.  No other specific complaints in a complete review of systems (except as listed in HPI above).     Objective:   Physical Exam        Assessment & Plan:

## 2016-12-13 ENCOUNTER — Encounter: Payer: Self-pay | Admitting: Internal Medicine

## 2016-12-13 NOTE — Telephone Encounter (Signed)
Sent letter to reschedule appt.

## 2017-01-11 ENCOUNTER — Other Ambulatory Visit: Payer: Self-pay | Admitting: Internal Medicine

## 2017-01-11 ENCOUNTER — Telehealth: Payer: Self-pay

## 2017-01-11 DIAGNOSIS — F411 Generalized anxiety disorder: Secondary | ICD-10-CM

## 2017-01-11 NOTE — Telephone Encounter (Signed)
Pt left v/m requesting refill but did not leave name of med; left v/m requesting cb with name of med or contact local pharmacy for refill; [facility] [place].

## 2017-01-28 NOTE — Telephone Encounter (Signed)
On 01/11/17 pharmacy requested refill on citalopram.

## 2017-02-23 ENCOUNTER — Encounter: Payer: Self-pay | Admitting: Internal Medicine

## 2017-03-22 ENCOUNTER — Other Ambulatory Visit: Payer: Self-pay | Admitting: Internal Medicine

## 2017-03-22 ENCOUNTER — Telehealth: Payer: Self-pay

## 2017-03-22 ENCOUNTER — Encounter: Payer: Self-pay | Admitting: Internal Medicine

## 2017-03-22 ENCOUNTER — Ambulatory Visit (INDEPENDENT_AMBULATORY_CARE_PROVIDER_SITE_OTHER): Payer: BC Managed Care – PPO | Admitting: Internal Medicine

## 2017-03-22 VITALS — BP 132/80 | HR 100 | Temp 98.0°F | Ht 62.0 in | Wt 247.0 lb

## 2017-03-22 DIAGNOSIS — Z1211 Encounter for screening for malignant neoplasm of colon: Secondary | ICD-10-CM

## 2017-03-22 DIAGNOSIS — F411 Generalized anxiety disorder: Secondary | ICD-10-CM | POA: Diagnosis not present

## 2017-03-22 DIAGNOSIS — Z139 Encounter for screening, unspecified: Secondary | ICD-10-CM

## 2017-03-22 DIAGNOSIS — Z0000 Encounter for general adult medical examination without abnormal findings: Secondary | ICD-10-CM

## 2017-03-22 DIAGNOSIS — Z131 Encounter for screening for diabetes mellitus: Secondary | ICD-10-CM

## 2017-03-22 DIAGNOSIS — Z13220 Encounter for screening for lipoid disorders: Secondary | ICD-10-CM

## 2017-03-22 DIAGNOSIS — F419 Anxiety disorder, unspecified: Secondary | ICD-10-CM | POA: Diagnosis not present

## 2017-03-22 DIAGNOSIS — K219 Gastro-esophageal reflux disease without esophagitis: Secondary | ICD-10-CM | POA: Diagnosis not present

## 2017-03-22 LAB — CBC
HCT: 38.5 % (ref 36.0–46.0)
Hemoglobin: 12.7 g/dL (ref 12.0–15.0)
MCHC: 33 g/dL (ref 30.0–36.0)
MCV: 91.3 fl (ref 78.0–100.0)
Platelets: 253 10*3/uL (ref 150.0–400.0)
RBC: 4.21 Mil/uL (ref 3.87–5.11)
RDW: 13.5 % (ref 11.5–15.5)
WBC: 8.3 10*3/uL (ref 4.0–10.5)

## 2017-03-22 LAB — LIPID PANEL
Cholesterol: 196 mg/dL (ref 0–200)
HDL: 54.7 mg/dL (ref >39.00)
LDL Cholesterol: 121 mg/dL — ABNORMAL HIGH (ref 0–99)
NonHDL: 141.12
Total CHOL/HDL Ratio: 4
Triglycerides: 103 mg/dL (ref 0.0–149.0)
VLDL: 20.6 mg/dL (ref 0.0–40.0)

## 2017-03-22 LAB — COMPREHENSIVE METABOLIC PANEL
ALT: 22 U/L (ref 0–35)
AST: 21 U/L (ref 0–37)
Albumin: 4.1 g/dL (ref 3.5–5.2)
Alkaline Phosphatase: 64 U/L (ref 39–117)
BUN: 10 mg/dL (ref 6–23)
CO2: 28 mEq/L (ref 19–32)
Calcium: 9.3 mg/dL (ref 8.4–10.5)
Chloride: 103 mEq/L (ref 96–112)
Creatinine, Ser: 0.65 mg/dL (ref 0.40–1.20)
GFR: 129.68 mL/min (ref >60.00)
Glucose, Bld: 93 mg/dL (ref 70–99)
Potassium: 3.6 mEq/L (ref 3.5–5.1)
Sodium: 136 mEq/L (ref 135–145)
Total Bilirubin: 0.8 mg/dL (ref 0.2–1.2)
Total Protein: 7.6 g/dL (ref 6.0–8.3)

## 2017-03-22 LAB — HEMOGLOBIN A1C: Hgb A1c MFr Bld: 5.6 % (ref 4.6–6.5)

## 2017-03-22 NOTE — Assessment & Plan Note (Signed)
Currently not an issue Will monitor 

## 2017-03-22 NOTE — Assessment & Plan Note (Signed)
Discussed the importance of taking the medication daily Support offered today

## 2017-03-22 NOTE — Telephone Encounter (Signed)
I spoke with pt and she has been lightheaded since last night; pt has some SOB but pt did not take her anxiety pill this morning. Pt is at work now; [facility] and [facility]; pt said she "sort of had something else she needed to do today and wanted to change the CPX today. Advised can cancel appt but could be charged a late cancellation fee and pt said she would keep appt. FYI to Pamala Hurry Baity NP.

## 2017-03-22 NOTE — Telephone Encounter (Signed)
PLEASE NOTE: All timestamps contained within this report are represented as [place] Standard Time. CONFIDENTIALTY NOTICE: This fax transmission is intended only for the addressee. It contains information that is legally privileged, confidential or otherwise protected from use or disclosure. If you are not the intended recipient, you are strictly prohibited from reviewing, disclosing, copying using or disseminating any of this information or taking any action in reliance on or regarding this information. If you have received this fax in error, please notify us immediately by telephone so that we can arrange for its return to us. Phone: [phone], Toll-Free: [phone], Fax: [phone] Page: 1 of 1 Call Id: [id number] [facility] Night - Client Nonclinical Telephone Record [facility] Client [facility] - Client [facility] - Night Contact Type Call Who Is Calling Patient / Member / Family / Caregiver Caller Name Brianna Schneider Caller Phone Number [phone] Patient Name Brianna Schneider Call Type Message Only Information Provided Reason for Call Request to Reschedule Office Appointment Initial Comment Caller stated she would like to change an appointment. Additional Comment Call Closed By: Roby LoftsShannon Young Transaction Date/Time: 03/21/2017 7:49:10 PM (ET)

## 2017-03-22 NOTE — Patient Instructions (Signed)

## 2017-03-22 NOTE — Progress Notes (Signed)
Subjective:    Patient ID: Brianna Schneider, female    DOB: September 02, 1975, 41 y.o.   MRN: [id number]  HPI  Pt presents to the clinic today for her annual exam.   Anxiety: Triggered by general life stress. She is taking Celexa as needed. She denies depression, SI/HI.  GERD: Currently not an issue. She is not taking anything OTC for her symptoms.   Flu: never Tetanus: 2014 Pap Smear: 09/2014 Mammogram: 10/2014 Vision Screening: as needed Dentist: as needed  Diet: She does eat meat. She consumes more veggies than fruits. She does eat some fried. She drinks mostly water, some soda. Exercise: None  Review of Systems      Past Medical History:  Diagnosis Date  . Anxiety   . GERD (gastroesophageal reflux disease)     Current Outpatient Medications  Medication Sig Dispense Refill  . citalopram (CELEXA) 10 MG tablet TAKE 1 TABLET BY MOUTH  DAILY 30 tablet 1   No current facility-administered medications for this visit.     No Known Allergies  Family History  Problem Relation Age of Onset  . Hypertension Mother   . Arthritis Maternal Grandmother   . Breast cancer Maternal Grandmother     Social History   Socioeconomic History  . Marital status: Married    Spouse name: Not on file  . Number of children: Not on file  . Years of education: Not on file  . Highest education level: Not on file  Social Needs  . Financial resource strain: Not on file  . Food insecurity - worry: Not on file  . Food insecurity - inability: Not on file  . Transportation needs - medical: Not on file  . Transportation needs - non-medical: Not on file  Occupational History  . Not on file  Tobacco Use  . Smoking status: Never Smoker  . Smokeless tobacco: Never Used  Substance and Sexual Activity  . Alcohol use: Yes    Alcohol/week: 0.0 oz    Comment: occasional  . Drug use: No  . Sexual activity: Yes    Birth control/protection: None  Other Topics Concern  . Not on file  Social History  Narrative  . Not on file     Constitutional: Denies fever, malaise, fatigue, headache or abrupt weight changes.  HEENT: Denies eye pain, eye redness, ear pain, ringing in the ears, wax buildup, runny nose, nasal congestion, bloody nose, or sore throat. Respiratory: Denies difficulty breathing, shortness of breath, cough or sputum production.   Cardiovascular: Denies chest pain, chest tightness, palpitations or swelling in the hands or feet.  Gastrointestinal: Denies abdominal pain, bloating, constipation, diarrhea or blood in the stool.  GU: Denies urgency, frequency, pain with urination, burning sensation, blood in urine, odor or discharge. Musculoskeletal: Denies decrease in range of motion, difficulty with gait, muscle pain or joint pain and swelling.  Skin: Denies redness, rashes, lesions or ulcercations.  Neurological: Denies dizziness, difficulty with memory, difficulty with speech or problems with balance and coordination.  Psych: Pt reports anxiety. Denies depression, SI/HI.  No other specific complaints in a complete review of systems (except as listed in HPI above).  Objective:   Physical Exam   BP 132/80   Pulse 100   Temp 98 F (36.7 C) (Oral)   Ht 5\' 2"  (1.575 m)   Wt 247 lb (112 kg)   LMP 03/21/2017   SpO2 98%   BMI 45.18 kg/m  Wt Readings from Last 3 Encounters:  03/22/17 247 lb (112  kg)  02/02/16 241 lb (109.3 kg)  10/20/15 231 lb (104.8 kg)    General: Appears her stated age, obese in NAD. Skin: Warm, dry and intact.  HEENT: Head: normal shape and size; Eyes: sclera white, no icterus, conjunctiva pink, PERRLA and EOMs intact; Ears: Tm's gray and intact, normal light reflex; Nose: mucosa pink and moist, septum midline; Throat/Mouth: Teeth present, mucosa pink and moist, no exudate, lesions or ulcerations noted.  Neck:  Neck supple, trachea midline. No masses, lumps or thyromegaly present.  Cardiovascular: Normal rate and rhythm. S1,S2 noted.  No murmur, rubs  or gallops noted. No JVD or BLE edema.  Pulmonary/Chest: Normal effort and positive vesicular breath sounds. No respiratory distress. No wheezes, rales or ronchi noted.  Abdomen: Soft and nontender. Normal bowel sounds. No distention or masses noted. Liver, spleen and kidneys non palpable. Musculoskeletal: Strength 5/5 BUE/BLE. No difficulty with gait.  Neurological: Alert and oriented. Cranial nerves II-XII grossly intact. Coordination normal.  Psychiatric: Mood and affect normal. Behavior is normal. Judgment and thought content normal.    BMET    Component Value Date/Time   NA 138 06/09/2015 1356   K 4.0 06/09/2015 1356   CL 101 06/09/2015 1356   CO2 29 06/09/2015 1356   GLUCOSE 93 06/09/2015 1356   BUN 12 06/09/2015 1356   CREATININE 0.71 06/09/2015 1356   CALCIUM 9.7 06/09/2015 1356   GFRNONAA >60 05/27/2015 0454   GFRAA >60 05/27/2015 0454    Lipid Panel  No results found for: CHOL, TRIG, HDL, CHOLHDL, VLDL, LDLCALC  CBC    Component Value Date/Time   WBC 10.5 06/09/2015 1356   RBC 4.36 06/09/2015 1356   HGB 13.2 06/09/2015 1356   HCT 39.5 06/09/2015 1356   PLT 264.0 06/09/2015 1356   MCV 90.7 06/09/2015 1356   MCH 30.6 05/27/2015 0454   MCHC 33.5 06/09/2015 1356   RDW 14.0 06/09/2015 1356   LYMPHSABS 1.0 05/27/2015 0454   MONOABS 0.9 05/27/2015 0454   EOSABS 0.0 05/27/2015 0454   BASOSABS 0.0 05/27/2015 0454    Hgb A1C No results found for: HGBA1C         Assessment & Plan:   Preventative Health Maintenance:  She declines flu shot today Tetanus UTD Pap smear due 2019 Mammogram ordered, she will [facility]  Encouraged her to consume a balanced diet and exercise regimen Advised her to see an eye doctor and dentist annually Will check CBC, CMET, Lipid and A1C today  RTC in 1 year, sooner if needed Nicki ReaperBAITY, Cailean Heacock, NP

## 2017-03-24 ENCOUNTER — Encounter: Payer: Self-pay | Admitting: *Deleted

## 2017-04-21 ENCOUNTER — Ambulatory Visit
Admission: RE | Admit: 2017-04-21 | Discharge: 2017-04-21 | Disposition: A | Discharge status: Home / Self Care | Payer: BC Managed Care – PPO | Source: Ambulatory Visit | Attending: Internal Medicine | Admitting: Internal Medicine

## 2017-04-21 DIAGNOSIS — Z139 Encounter for screening, unspecified: Secondary | ICD-10-CM

## 2017-04-22 ENCOUNTER — Other Ambulatory Visit: Payer: Self-pay | Admitting: Internal Medicine

## 2017-04-22 DIAGNOSIS — F411 Generalized anxiety disorder: Secondary | ICD-10-CM

## 2017-05-18 IMAGING — CT CT ABD-PELV W/ CM
2 of 4 series · 11 of 46 positions shown, 12 images · IV contrast (Iodine)
Comparison: None.

CLINICAL DATA: Abdominal pain and vomiting. Elevated liver function
tests. Prior cholecystectomy.

EXAM:
CT ABDOMEN AND PELVIS WITH CONTRAST
TECHNIQUE: Multidetector CT imaging of the abdomen and pelvis was performed
using the standard protocol following bolus administration of
intravenous contrast.
CONTRAST:  100mL OMNIPAQUE IOHEXOL 300 MG/ML  SOLN

[Series 201: routine, idose (2) · axial · 0.79mm/px · z∈[-443,-78]mm · 8 of 87 slices shown, 9 images]
[im 7/87  soft-tissue]
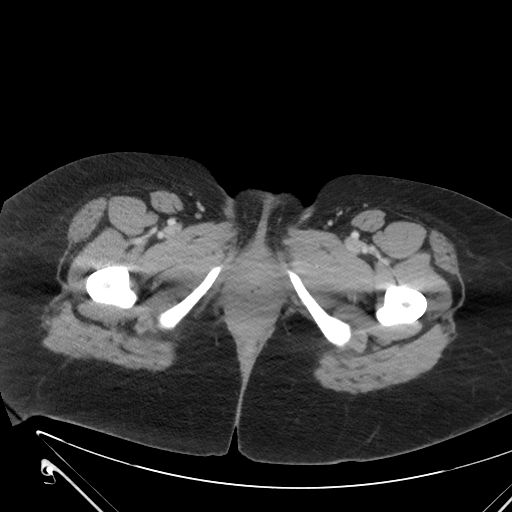
[im 7/87  bone]
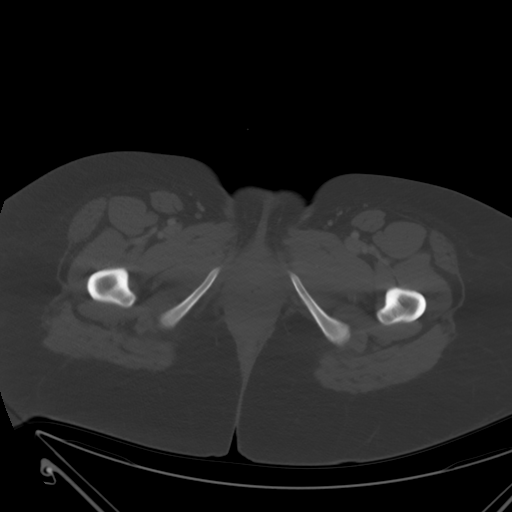
[im 18/87  soft-tissue]
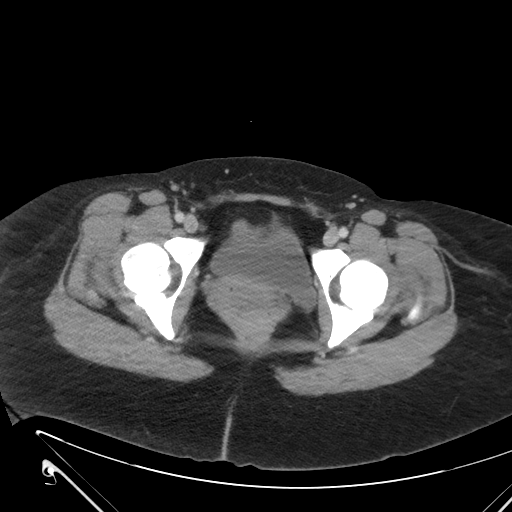
[im 28/87  soft-tissue]
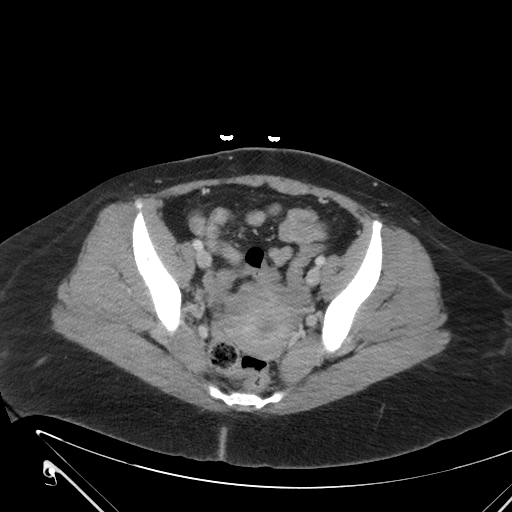
[im 38/87  soft-tissue]
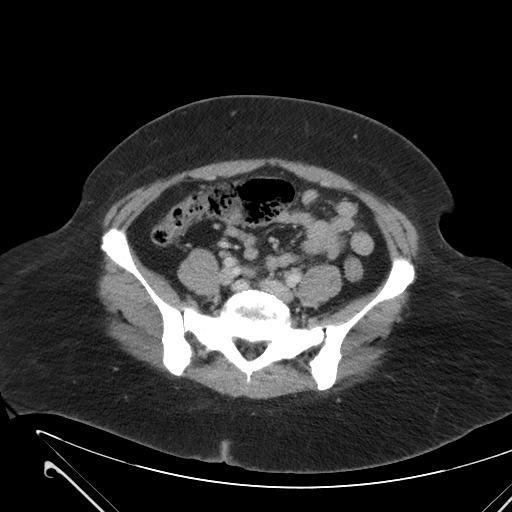
[im 49/87  soft-tissue]
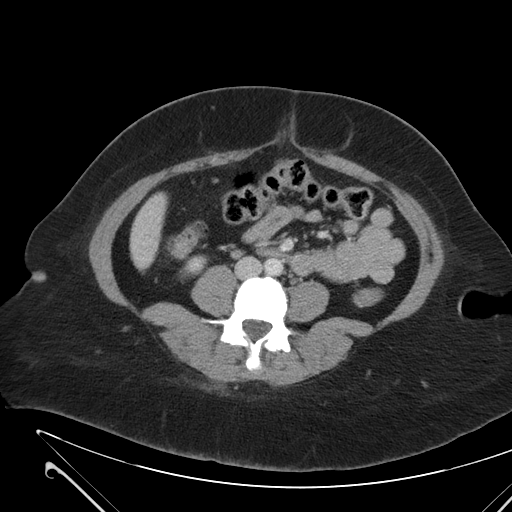
[im 59/87  soft-tissue]
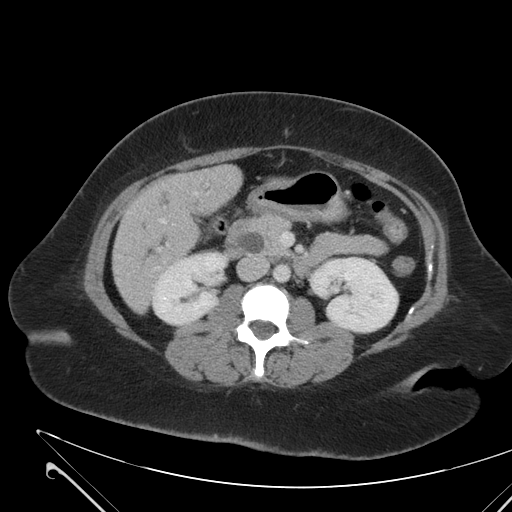
[im 69/87  soft-tissue]
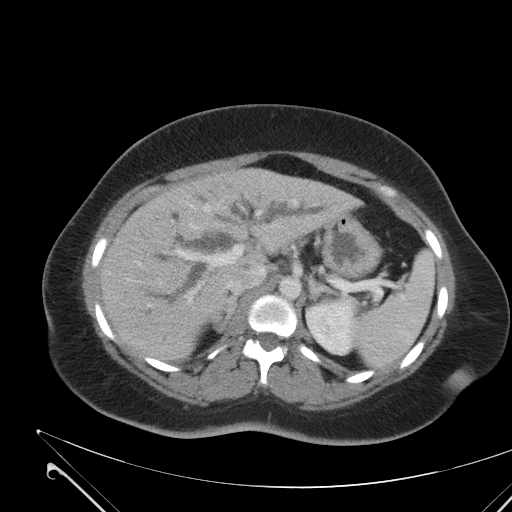
[im 80/87  soft-tissue]
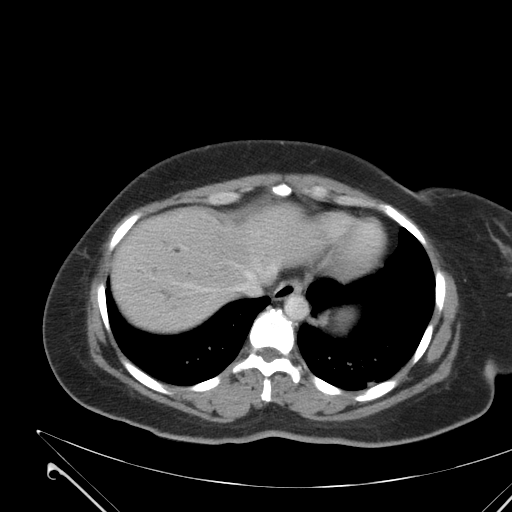

[Series 203: coronals, idose (2) · coronal · 0.45mm/px · 3 of 129 slices shown]
[im 43/129  soft-tissue]
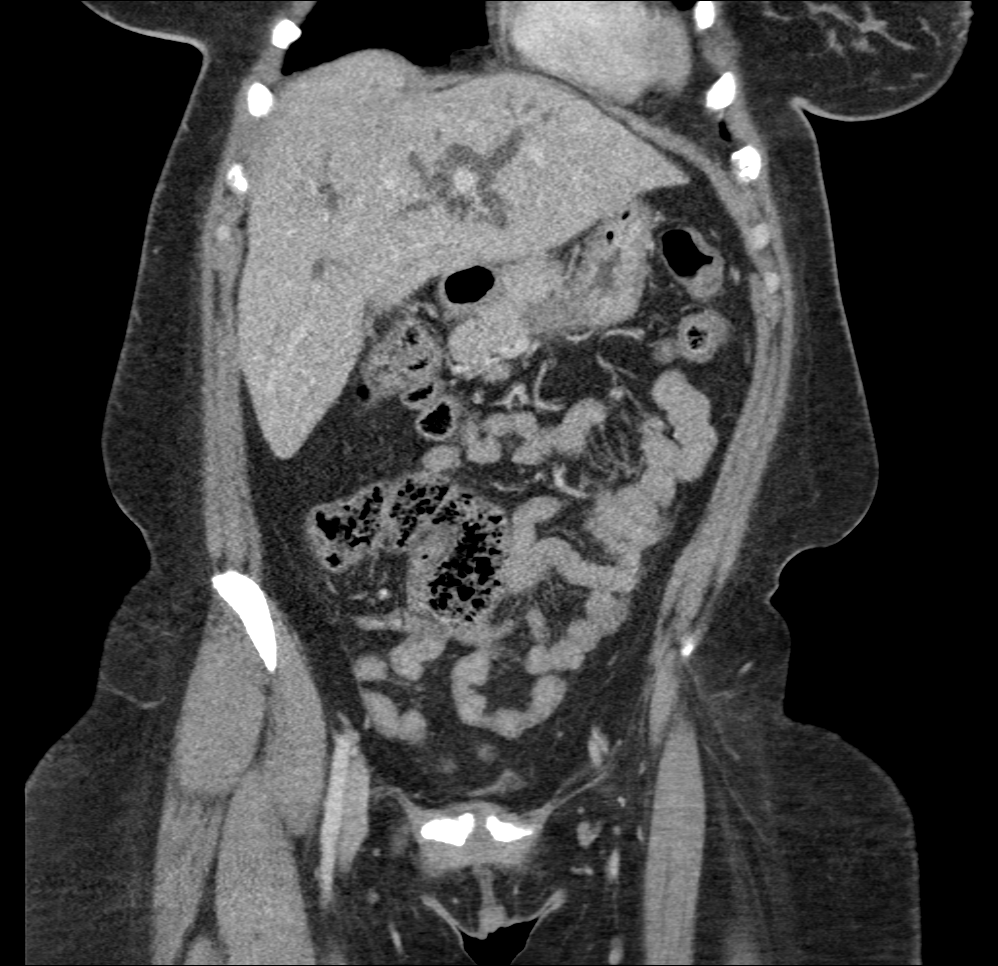
[im 57/129  soft-tissue]
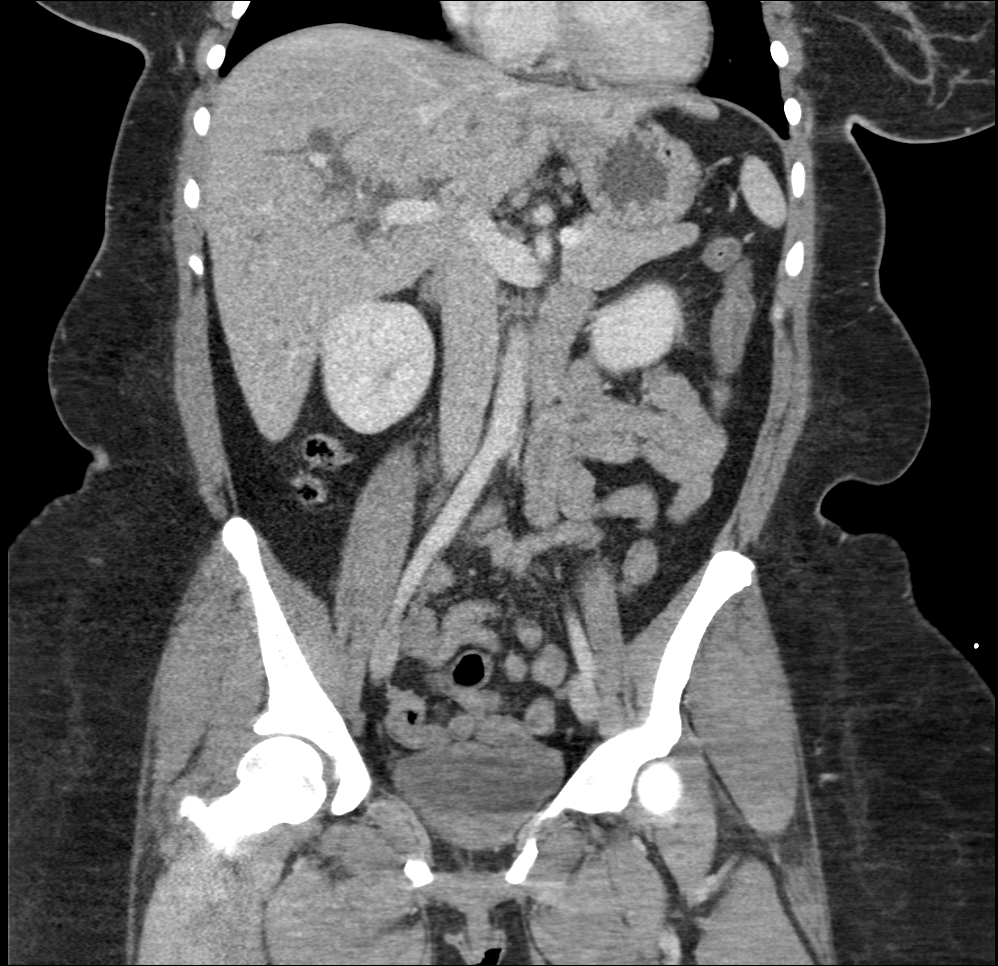
[im 72/129  soft-tissue]
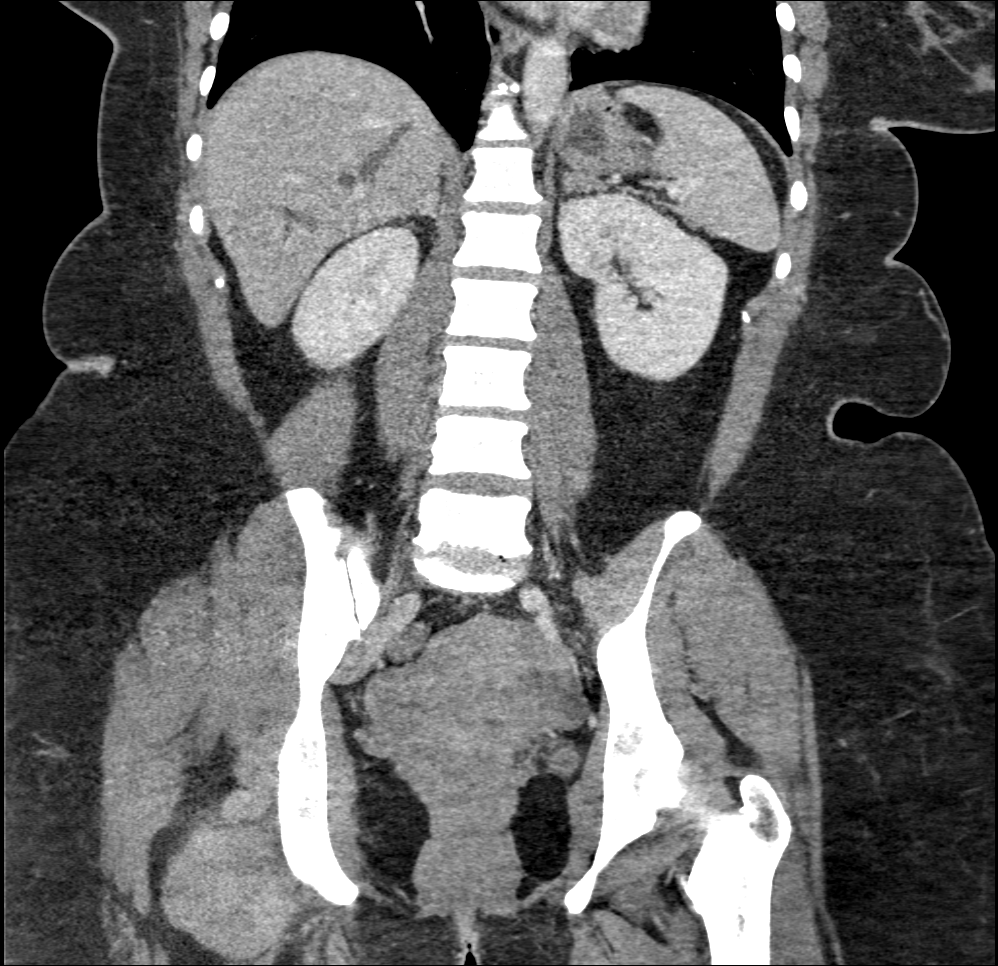

[11 of 46 positions shown; findings below may reference images not displayed]

FINDINGS: Lower chest:  No acute findings.

Hepatobiliary: No liver masses identified. Prior cholecystectomy
noted. Severe diffuse intra and extrahepatic biliary ductal
dilatation is seen. Common bile duct measures 17 mm in diameter.
Smooth tapering of the distal common bile duct is seen in the
pancreatic head. No calcified common duct stones seen.

Pancreas: No evidence of pancreatic mass or pancreatic ductal
dilatation. No evidence of peripancreatic inflammatory changes or
fluid collections. No evidence of pancreatic calcifications.

Spleen: Within normal limits in size and appearance.

Adrenals/Urinary Tract: No masses identified. No evidence of
hydronephrosis.

Stomach/Bowel: No evidence of obstruction, inflammatory process, or
abnormal fluid collections.

Vascular/Lymphatic: No pathologically enlarged lymph nodes. No
evidence of abdominal aortic aneurysm.

Reproductive: No mass or other significant abnormality.

Other: None.

Musculoskeletal:  No suspicious bone lesions identified.
IMPRESSION: Prior cholecystectomy. Severe diffuse biliary ductal dilatation,
with smoothly tapered stricture of distal common bile duct within
pancreatic head. No definite choledocholithiasis or pancreatic mass
visualized by CT. Consider a abdomen MRI and MRCP without and with
contrast for further evaluation.

## 2017-05-19 IMAGING — RF DG ERCP WO/W SPHINCTEROTOMY
1 series · 3 of 3 positions shown · non-contrast
Comparison: CT of the abdomen on 05/25/2015.

CLINICAL DATA: Biliary obstruction and choledocholithiasis.

EXAM:
ERCP
TECHNIQUE: Multiple spot images obtained with the fluoroscopic device and
submitted for interpretation post-procedure.

[Series 1: run · 3 of 3 slices shown]
[im 1/3]
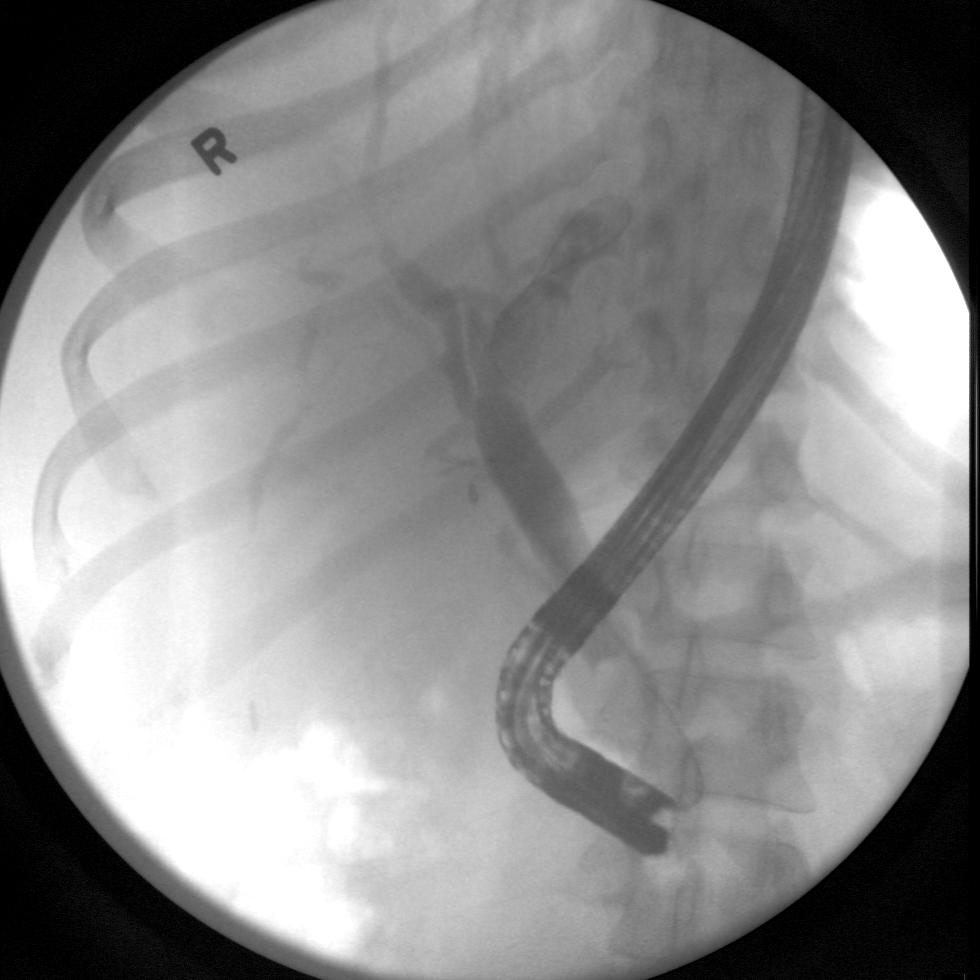
[im 2/3]
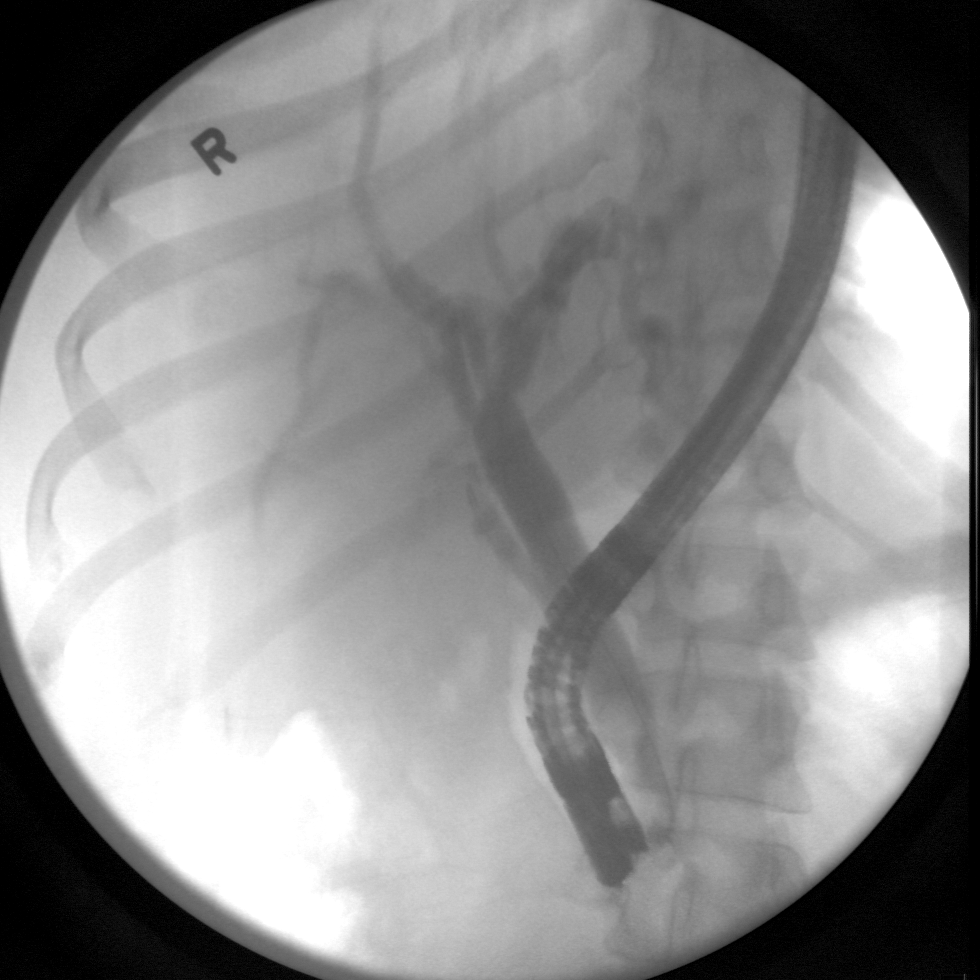
[im 3/3]
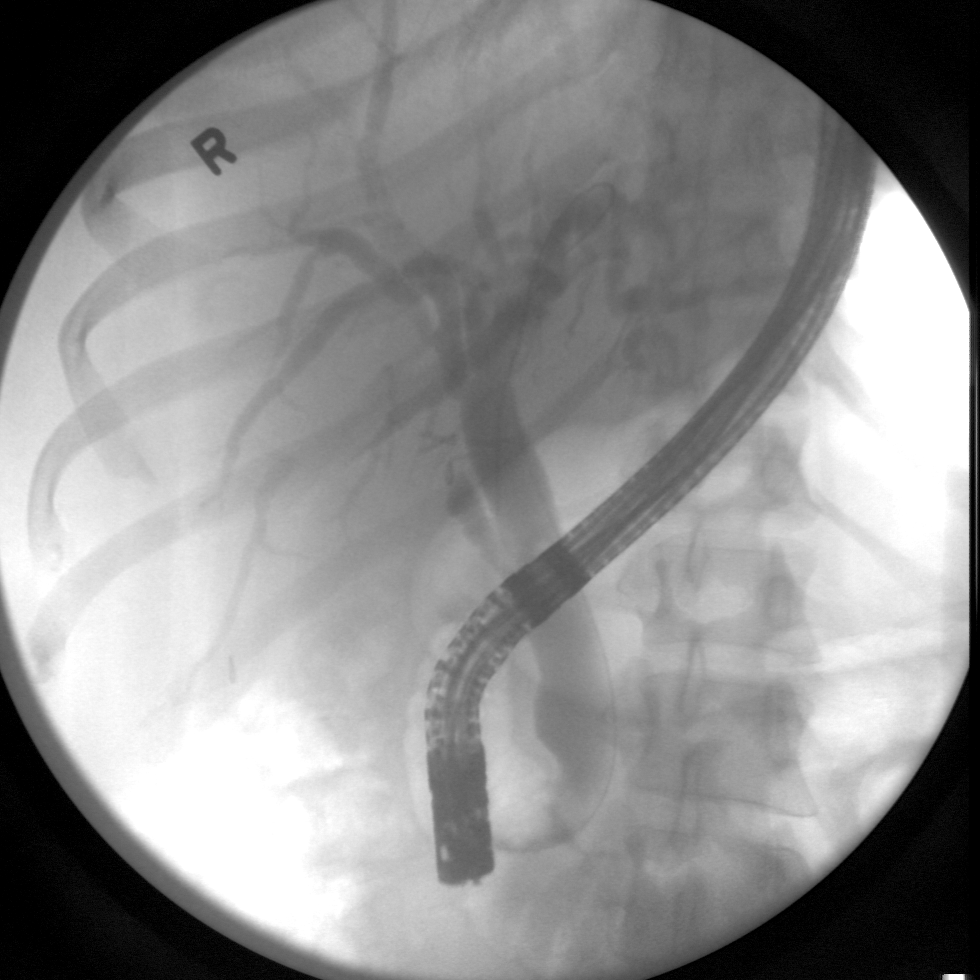

[3 of 3 positions shown; findings below may reference images not displayed]

FINDINGS: Imaging obtained with a C-arm demonstrates cannulation of the common
bile duct with contrast injection showing a diffusely dilated common
bile duct and diffuse dilatation of intrahepatic ducts. The
gallbladder has been removed. Balloon extraction of calculi was
performed. Completion cholangiogram shows no discrete filling
defects in the opacified ducts.
IMPRESSION: Imaging during ERCP shows diffuse biliary dilatation. After balloon
extraction of calculi, completion cholangiogram shows no discrete
filling defects.

These images were submitted for radiologic interpretation only.
Please see the procedural report for the amount of contrast and the
fluoroscopy time utilized.

## 2017-06-22 ENCOUNTER — Encounter: Payer: Self-pay | Admitting: Primary Care

## 2017-06-22 ENCOUNTER — Ambulatory Visit: Payer: BC Managed Care – PPO | Admitting: Primary Care

## 2017-06-22 VITALS — BP 122/74 | HR 68 | Temp 98.1°F | Ht 62.0 in | Wt 252.5 lb

## 2017-06-22 DIAGNOSIS — J069 Acute upper respiratory infection, unspecified: Secondary | ICD-10-CM | POA: Diagnosis not present

## 2017-06-22 MED ORDER — BENZONATATE 200 MG PO CAPS: 200.0000 mg | ORAL_CAPSULE | Freq: Three times a day (TID) | ORAL | 0 refills | Status: DC | PRN

## 2017-06-22 NOTE — Patient Instructions (Signed)
Your symptoms are representative of a viral illness which will resolve on its own over time. Our goal is to treat your symptoms in order to aid your body in the healing process and to make you more comfortable.   You may take Benzonatate capsules for cough. Take 1 capsule by mouth three times daily as needed for cough.  Try taking Advil or Tylenol for body aches, headaches.  Drink plenty of water to stay hydrated and rest.  Please call me next week if no improvement in symptoms.  It was a pleasure meeting you!   Cough, Adult Coughing is a reflex that clears your throat and your airways. Coughing helps to heal and protect your lungs. It is normal to cough occasionally, but a cough that happens with other symptoms or lasts a long time may be a sign of a condition that needs treatment. A cough may last only 2-3 weeks (acute), or it may last longer than 8 weeks (chronic). What are the causes? Coughing is commonly caused by:  Breathing in substances that irritate your lungs.  A viral or bacterial respiratory infection.  Allergies.  Asthma.  Postnasal drip.  Smoking.  Acid backing up from the stomach into the esophagus (gastroesophageal reflux).  Certain medicines.  Chronic lung problems, including COPD (or rarely, lung cancer).  Other medical conditions such as heart failure.  Follow these instructions at home: Pay attention to any changes in your symptoms. Take these actions to help with your discomfort:  Take medicines only as told by your health care provider. ? If you were prescribed an antibiotic medicine, take it as told by your health care provider. Do not stop taking the antibiotic even if you start to feel better. ? Talk with your health care provider before you take a cough suppressant medicine.  Drink enough fluid to keep your urine clear or pale yellow.  If the air is dry, use a cold steam vaporizer or humidifier in your bedroom or your home to help loosen  secretions.  Avoid anything that causes you to cough at work or at home.  If your cough is worse at night, try sleeping in a semi-upright position.  Avoid cigarette smoke. If you smoke, quit smoking. If you need help quitting, ask your health care provider.  Avoid caffeine.  Avoid alcohol.  Rest as needed.  Contact a health care provider if:  You have new symptoms.  You cough up pus.  Your cough does not get better after 2-3 weeks, or your cough gets worse.  You cannot control your cough with suppressant medicines and you are losing sleep.  You develop pain that is getting worse or pain that is not controlled with pain medicines.  You have a fever.  You have unexplained weight loss.  You have night sweats. Get help right away if:  You cough up blood.  You have difficulty breathing.  Your heartbeat is very fast. This information is not intended to replace advice given to you by your health care provider. Make sure you discuss any questions you have with your health care provider. Document Released: 10/23/2010 Document Revised: 10/02/2015 Document Reviewed: 07/03/2014 Elsevier Interactive Patient Education  [employer]2018 [employer] Inc[employer]

## 2017-06-22 NOTE — Progress Notes (Signed)
Subjective:    Patient ID: Brianna Schneider, female    DOB: 17-Sep-1975, 42 y.o.   MRN: [id number]  HPI  Brianna Schneider is a 42 year old female with a history of GERD who presents today with a chief complaint of cough.  She also reports body aches, fatigue, intermittent sore throat, rhinorrhea. Symptoms have been present for the past 2 weeks. Two days ago her cough progressed. Overall she's feeling better, but is frustrated by the lingering cough.   She denies fevers, headaches. She's been taking Mucinex, Theraflu Day and Night without much improvement in cough. She's been exposed to flu and other viral illnesses by co-workers. She had her flu shot this year.   Review of Systems  Constitutional: Positive for fatigue. Negative for fever.  HENT: Positive for rhinorrhea and sore throat. Negative for congestion.   Respiratory: Positive for cough. Negative for shortness of breath and wheezing.   Neurological: Negative for headaches.       Past Medical History:  Diagnosis Date  . Anxiety   . GERD (gastroesophageal reflux disease)      Social History   Socioeconomic History  . Marital status: Married    Spouse name: Not on file  . Number of children: Not on file  . Years of education: Not on file  . Highest education level: Not on file  Social Needs  . Financial resource strain: Not on file  . Food insecurity - worry: Not on file  . Food insecurity - inability: Not on file  . Transportation needs - medical: Not on file  . Transportation needs - non-medical: Not on file  Occupational History  . Not on file  Tobacco Use  . Smoking status: Never Smoker  . Smokeless tobacco: Never Used  Substance and Sexual Activity  . Alcohol use: Yes    Alcohol/week: 0.0 oz    Comment: occasional  . Drug use: No  . Sexual activity: Yes    Birth control/protection: None  Other Topics Concern  . Not on file  Social History Narrative  . Not on file    Past Surgical History:  Procedure  Laterality Date  . CHOLECYSTECTOMY  2007  . ERCP N/A 05/26/2015   Procedure: ENDOSCOPIC RETROGRADE CHOLANGIOPANCREATOGRAPHY (ERCP);  Surgeon: Jeani HawkingPatrick Hung, MD;  [facility];  Service: Endoscopy;  Laterality: N/A;  . WISDOM TOOTH EXTRACTION      Family History  Problem Relation Age of Onset  . Hypertension Mother   . Arthritis Maternal Grandmother   . Breast cancer Maternal Grandmother     No Known Allergies  Current Outpatient Medications on File Prior to Visit  Medication Sig Dispense Refill  . citalopram (CELEXA) 10 MG tablet TAKE 1 TABLET BY MOUTH  DAILY 90 tablet 2   No current facility-administered medications on file prior to visit.     BP 122/74   Pulse 68   Temp 98.1 F (36.7 C) (Oral)   Ht 5\' 2"  (1.575 m)   Wt 252 lb 8 oz (114.5 kg)   LMP 06/17/2017   SpO2 99%   BMI 46.18 kg/m    Objective:   Physical Exam  Constitutional: She appears well-nourished.  HENT:  Right Ear: Tympanic membrane and ear canal normal.  Left Ear: Tympanic membrane and ear canal normal.  Nose: Right sinus exhibits no maxillary sinus tenderness and no frontal sinus tenderness. Left sinus exhibits no maxillary sinus tenderness and no frontal sinus tenderness.  Mouth/Throat: Oropharynx is clear and moist.  Eyes:  Conjunctivae are normal.  Neck: Neck supple.  Cardiovascular: Normal rate and regular rhythm.  Pulmonary/Chest: Effort normal and breath sounds normal. She has no wheezes. She has no rales.  Lymphadenopathy:    She has no cervical adenopathy.  Skin: Skin is warm and dry.          Assessment & Plan:  URI:   Cough, fatigue, body aches x 2 weeks. Overall feeling slightly better except for lingering cough. Exam today unremarkable, doubt bacterial involvement based off of presentation, exam, and HPI. Suspect viral illness with post viral cough. Rx for Jerilynn Som sent to pharmacy. Discussed Tylenol and Advil PRN. Fluids, rest, follow up precautions provided.     Doreene Nest, NP

## 2018-03-28 ENCOUNTER — Encounter: Payer: Self-pay | Admitting: Internal Medicine

## 2018-03-28 ENCOUNTER — Ambulatory Visit (INDEPENDENT_AMBULATORY_CARE_PROVIDER_SITE_OTHER): Payer: BC Managed Care – PPO | Admitting: Internal Medicine

## 2018-03-28 VITALS — BP 126/84 | HR 64 | Temp 97.9°F | Wt 255.0 lb

## 2018-03-28 DIAGNOSIS — G8929 Other chronic pain: Secondary | ICD-10-CM | POA: Diagnosis not present

## 2018-03-28 DIAGNOSIS — M5441 Lumbago with sciatica, right side: Secondary | ICD-10-CM | POA: Diagnosis not present

## 2018-03-28 MED ORDER — PREDNISONE 10 MG PO TABS: ORAL_TABLET | ORAL | 0 refills | Status: DC

## 2018-03-28 MED ORDER — METHOCARBAMOL 500 MG PO TABS: 500.0000 mg | ORAL_TABLET | Freq: Every evening | ORAL | 0 refills | Status: DC | PRN

## 2018-03-28 NOTE — Patient Instructions (Signed)

## 2018-03-28 NOTE — Progress Notes (Signed)
Subjective:    Patient ID: Brianna Schneider, female    DOB: 03-27-1976, 42 y.o.   MRN: [id number]  HPI  Pt presents to the clinic today with c/o right sided low back pain. She reports this started a few months ago. She describes the pain as achy, but can be sharp and shooting at times. The pain radiates into her right leg. She reports associated intermittent numbness and tingling. She denies weakness of the RLE. She reports 1 year ago, she fell on some ice and landed on her right side. She has tried Ibuprofen, Aleve, massage and heat with minimal relief. She denies issues with bowel or bladder.  Review of Systems      Past Medical History:  Diagnosis Date  . Anxiety   . GERD (gastroesophageal reflux disease)     Current Outpatient Medications  Medication Sig Dispense Refill  . citalopram (CELEXA) 10 MG tablet TAKE 1 TABLET BY MOUTH  DAILY (Patient not taking: Reported on 03/28/2018) 90 tablet 2   No current facility-administered medications for this visit.     No Known Allergies  Family History  Problem Relation Age of Onset  . Hypertension Mother   . Arthritis Maternal Grandmother   . Breast cancer Maternal Grandmother     Social History   Socioeconomic History  . Marital status: Married    Spouse name: Not on file  . Number of children: Not on file  . Years of education: Not on file  . Highest education level: Not on file  Occupational History  . Not on file  Social Needs  . Financial resource strain: Not on file  . Food insecurity:    Worry: Not on file    Inability: Not on file  . Transportation needs:    Medical: Not on file    Non-medical: Not on file  Tobacco Use  . Smoking status: Never Smoker  . Smokeless tobacco: Never Used  Substance and Sexual Activity  . Alcohol use: Yes    Alcohol/week: 0.0 standard drinks    Comment: occasional  . Drug use: No  . Sexual activity: Yes    Birth control/protection: None  Lifestyle  . Physical activity:   Days per week: Not on file    Minutes per session: Not on file  . Stress: Not on file  Relationships  . Social connections:    Talks on phone: Not on file    Gets together: Not on file    Attends religious service: Not on file    Active member of club or organization: Not on file    Attends meetings of clubs or organizations: Not on file    Relationship status: Not on file  . Intimate partner violence:    Fear of current or ex partner: Not on file    Emotionally abused: Not on file    Physically abused: Not on file    Forced sexual activity: Not on file  Other Topics Concern  . Not on file  Social History Narrative  . Not on file     Constitutional: Denies fever, malaise, fatigue, headache or abrupt weight changes.  Respiratory: Denies difficulty breathing, shortness of breath, cough or sputum production.   Cardiovascular: Denies chest pain, chest tightness, palpitations or swelling in the hands or feet.  Gastrointestinal: Denies abdominal pain, bloating, constipation, diarrhea or blood in the stool.  GU: Denies urgency, frequency, pain with urination, burning sensation, blood in urine, odor or discharge. Musculoskeletal: Pt reports back pain, right  leg pain. Denies decrease in range of motion, difficulty with gait, or joint swelling.  Skin: Denies redness, rashes, lesions or ulcercations.  Neurological: Pt reports numbness and tingling in right leg. Denies weakness, or problems with balance and coordination.   No other specific complaints in a complete review of systems (except as listed in HPI above).  Objective:   Physical Exam  BP 126/84   Pulse 64   Temp 97.9 F (36.6 C) (Oral)   Wt 281 lb (127.5 kg)   LMP 03/15/2018   SpO2 100%   BMI 51.40 kg/m  Wt Readings from Last 3 Encounters:  03/28/18 281 lb (127.5 kg)  06/22/17 252 lb 8 oz (114.5 kg)  03/22/17 247 lb (112 kg)    General: Appears her stated age, obese, in NAD. Skin: Warm, dry and intact. No rashes  ulcerations noted. Musculoskeletal: Decreased flexion due to pain. Normal extension and rotation of the spine. Bony tenderness noted over the lumbar spine and right SI joint. Normal adduction, abduction, interna and external rotation of the right hip. Strength 5/5 BLE. Able to walk on heels and toes. Normal gait. Neurological: Alert and oriented. Positive SLR on the right.   BMET    Component Value Date/Time   NA 136 03/22/2017 1506   K 3.6 03/22/2017 1506   CL 103 03/22/2017 1506   CO2 28 03/22/2017 1506   GLUCOSE 93 03/22/2017 1506   BUN 10 03/22/2017 1506   CREATININE 0.65 03/22/2017 1506   CALCIUM 9.3 03/22/2017 1506   GFRNONAA >60 05/27/2015 0454   GFRAA >60 05/27/2015 0454    Lipid Panel     Component Value Date/Time   CHOL 196 03/22/2017 1506   TRIG 103.0 03/22/2017 1506   HDL 54.70 03/22/2017 1506   CHOLHDL 4 03/22/2017 1506   VLDL 20.6 03/22/2017 1506   LDLCALC 121 (H) 03/22/2017 1506    CBC    Component Value Date/Time   WBC 8.3 03/22/2017 1506   RBC 4.21 03/22/2017 1506   HGB 12.7 03/22/2017 1506   HCT 38.5 03/22/2017 1506   PLT 253.0 03/22/2017 1506   MCV 91.3 03/22/2017 1506   MCH 30.6 05/27/2015 0454   MCHC 33.0 03/22/2017 1506   RDW 13.5 03/22/2017 1506   LYMPHSABS 1.0 05/27/2015 0454   MONOABS 0.9 05/27/2015 0454   EOSABS 0.0 05/27/2015 0454   BASOSABS 0.0 05/27/2015 0454    Hgb A1C Lab Results  Component Value Date   HGBA1C 5.6 03/22/2017            Assessment & Plan:   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

## 2018-05-15 ENCOUNTER — Ambulatory Visit (INDEPENDENT_AMBULATORY_CARE_PROVIDER_SITE_OTHER)
Admission: RE | Admit: 2018-05-15 | Discharge: 2018-05-15 | Disposition: A | Discharge status: Home / Self Care | Payer: BC Managed Care – PPO | Source: Ambulatory Visit | Attending: Family Medicine | Admitting: Family Medicine

## 2018-05-15 ENCOUNTER — Encounter: Payer: Self-pay | Admitting: Family Medicine

## 2018-05-15 ENCOUNTER — Ambulatory Visit (INDEPENDENT_AMBULATORY_CARE_PROVIDER_SITE_OTHER): Payer: BC Managed Care – PPO | Admitting: Family Medicine

## 2018-05-15 VITALS — BP 120/80 | HR 78 | Temp 97.7°F | Ht 62.0 in | Wt 253.0 lb

## 2018-05-15 DIAGNOSIS — M5416 Radiculopathy, lumbar region: Secondary | ICD-10-CM

## 2018-05-15 MED ORDER — CYCLOBENZAPRINE HCL 10 MG PO TABS: 5.0000 mg | ORAL_TABLET | Freq: Every day | ORAL | 0 refills | Status: DC

## 2018-05-15 MED ORDER — TRAMADOL HCL 50 MG PO TABS: 50.0000 mg | ORAL_TABLET | Freq: Four times a day (QID) | ORAL | 0 refills | Status: AC | PRN

## 2018-05-15 MED ORDER — PREDNISONE 20 MG PO TABS: ORAL_TABLET | ORAL | 0 refills | Status: DC

## 2018-05-15 NOTE — Progress Notes (Signed)
Dr. Karleen HampshireSpencer T. Cornelio Parkerson, MD, CAQ Sports Medicine [facility] [address] [place], [id number] Phone: [phone] Fax: [phone]  05/15/2018  Patient: Brianna Schneider, MRN: [id number], DOB: Feb 02, 1976, 43 y.o.  Primary Physician:  Brianna MunroeBaity, Regina W, NP   Chief Complaint  Patient presents with  . Sciatica    Started Friday Morning   Subjective:   Brianna Schneider is a 43 y.o. very pleasant female patient who presents with the following: Back Pain  ongoing for approximately: 4 d, prior episode 2 mo ago The patient has had back pain before. The back pain is localized into the lumbar spine area. They also describe R radiculopathy to the knee.  No numbness or tingling. No bowel or bladder incontinence. No focal weakness. Prior interventions: Tylenol and NSAIDS Physical therapy: No Chiropractic manipulations: No Acupuncture: No Osteopathic manipulation: No Heat or cold: Minimal effect  Past Medical History, Surgical History, Family History, Medications, Allergies have been reviewed and updated if relevant.  Patient Active Problem List   Diagnosis Date Noted  . Esophageal reflux   . Generalized anxiety disorder 04/24/2015    Past Medical History:  Diagnosis Date  . Anxiety   . GERD (gastroesophageal reflux disease)     Past Surgical History:  Procedure Laterality Date  . CHOLECYSTECTOMY  2007  . ERCP N/A 05/26/2015   Procedure: ENDOSCOPIC RETROGRADE CHOLANGIOPANCREATOGRAPHY (ERCP);  Surgeon: Brianna HawkingPatrick Hung, MD;  [facility];  Service: Endoscopy;  Laterality: N/A;  . WISDOM TOOTH EXTRACTION      Social History   Socioeconomic History  . Marital status: Married    Spouse name: Not on file  . Number of children: Not on file  . Years of education: Not on file  . Highest education level: Not on file  Occupational History  . Not on file  Social Needs  . Financial resource strain: Not on file  . Food insecurity:    Worry: Not on  file    Inability: Not on file  . Transportation needs:    Medical: Not on file    Non-medical: Not on file  Tobacco Use  . Smoking status: Never Smoker  . Smokeless tobacco: Never Used  Substance and Sexual Activity  . Alcohol use: Yes    Alcohol/week: 0.0 standard drinks    Comment: occasional  . Drug use: No  . Sexual activity: Yes    Birth control/protection: None  Lifestyle  . Physical activity:    Days per week: Not on file    Minutes per session: Not on file  . Stress: Not on file  Relationships  . Social connections:    Talks on phone: Not on file    Gets together: Not on file    Attends religious service: Not on file    Active member of club or organization: Not on file    Attends meetings of clubs or organizations: Not on file    Relationship status: Not on file  . Intimate partner violence:    Fear of current or ex partner: Not on file    Emotionally abused: Not on file    Physically abused: Not on file    Forced sexual activity: Not on file  Other Topics Concern  . Not on file  Social History Narrative  . Not on file    Family History  Problem Relation Age of Onset  . Hypertension Mother   . Arthritis Maternal Grandmother   . Breast cancer Maternal Grandmother  No Known Allergies  Medication list reviewed and updated in full in [facility].  GEN: No fevers, chills. Nontoxic. Primarily MSK c/o today. MSK: Detailed in the HPI GI: tolerating PO intake without difficulty Neuro: As above  Otherwise the pertinent positives of the ROS are noted above.    Objective:   Blood pressure 120/80, pulse 78, temperature 97.7 F (36.5 C), temperature source Oral, height 5\' 2"  (1.575 m), weight 253 lb (114.8 kg).  Gen: Well-developed,well-nourished,in no acute distress; alert,appropriate and cooperative throughout examination HEENT: Normocephalic and atraumatic without obvious abnormalities.  Ears, externally no deformities Pulm: Breathing comfortably  in no respiratory distress Range of motion at  the waist: Flexion, rotation and lateral bending: limited to 45 deg of flexion, o/Schneider WNL   No echymosis or edema Rises to examination table with no difficulty Gait: minimally antalgic  Inspection/Deformity: No abnormality Paraspinus T:  R > L l2-s1 ttp  B Ankle Dorsiflexion (L5,4): 5/5 B Great Toe Dorsiflexion (L5,4): 5/5 Heel Walk (L5): WNL Toe Walk (S1): WNL Rise/Squat (L4): WNL, mild pain  SENSORY B Medial Foot (L4): WNL B Dorsum (L5): WNL B Lateral (S1): WNL Light Touch: WNL Pinprick: WNL  REFLEXES Knee (L4): 2+ Ankle (S1): 2+  B SLR, seated: B back pain B SLR, supine: unable to complete Unable to complete supine exam B Greater Troch: NT B Log Roll: neg B Sciatic Notch: NT  Radiology: No results found.  Assessment and Plan:   Lumbar radiculopathy, right - Plan: DG Lumbar Spine Complete  Morbid obesity (HCC)  Anatomy reviewed. Conservative algorithms for acute back pain generally begin with the following: NSAIDS, Muscle Relaxants, Mild pain medication  Start with medications, core rehab, and basic piriformis stretches.  Check plain film OOW until Monday  Follow-up: No follow-ups on file.  Meds ordered this encounter  Medications  . predniSONE (DELTASONE) 20 MG tablet    Sig: 2 tabs po for 7 days, then 1 tab po for 7 days    Dispense:  21 tablet    Refill:  0  . cyclobenzaprine (FLEXERIL) 10 MG tablet    Sig: Take 0.5-1 tablets (5-10 mg total) by mouth at bedtime.    Dispense:  30 tablet    Refill:  0  . traMADol (ULTRAM) 50 MG tablet    Sig: Take 1 tablet (50 mg total) by mouth every 6 (six) hours as needed for up to 7 days.    Dispense:  20 tablet    Refill:  0   Orders Placed This Encounter  Procedures  . DG Lumbar Spine Complete    Signed,  Lilyonna Steidle T. Jaicey Sweaney, MD   Allergies as of 05/15/2018   No Known Allergies     Medication List       Accurate as of May 15, 2018 12:24 PM. Always  use your most recent med list.        citalopram 10 MG tablet Commonly known as:  CELEXA TAKE 1 TABLET BY MOUTH  DAILY   cyclobenzaprine 10 MG tablet Commonly known as:  FLEXERIL Take 0.5-1 tablets (5-10 mg total) by mouth at bedtime.   methocarbamol 500 MG tablet Commonly known as:  ROBAXIN Take 1 tablet (500 mg total) by mouth at bedtime as needed for muscle spasms.   predniSONE 20 MG tablet Commonly known as:  DELTASONE 2 tabs po for 7 days, then 1 tab po for 7 days   traMADol 50 MG tablet Commonly known as:  ULTRAM Take 1 tablet (50  mg total) by mouth every 6 (six) hours as needed for up to 7 days.

## 2018-05-22 ENCOUNTER — Telehealth: Payer: Self-pay | Admitting: *Deleted

## 2018-05-22 ENCOUNTER — Other Ambulatory Visit: Payer: Self-pay | Admitting: Family Medicine

## 2018-05-22 DIAGNOSIS — M5416 Radiculopathy, lumbar region: Secondary | ICD-10-CM

## 2018-05-22 NOTE — Telephone Encounter (Signed)
done

## 2018-05-22 NOTE — Telephone Encounter (Signed)
Spoke to pt who states she was recently seen and had xrays done for her back pain; requesting a referral for PT

## 2018-05-22 NOTE — Progress Notes (Signed)
done

## 2018-05-31 ENCOUNTER — Other Ambulatory Visit: Payer: Self-pay | Admitting: Internal Medicine

## 2018-05-31 DIAGNOSIS — Z1231 Encounter for screening mammogram for malignant neoplasm of breast: Secondary | ICD-10-CM

## 2018-07-04 ENCOUNTER — Ambulatory Visit
Admission: RE | Admit: 2018-07-04 | Discharge: 2018-07-04 | Disposition: A | Discharge status: Home / Self Care | Payer: BC Managed Care – PPO | Source: Ambulatory Visit | Attending: Internal Medicine | Admitting: Internal Medicine

## 2018-07-04 DIAGNOSIS — Z1231 Encounter for screening mammogram for malignant neoplasm of breast: Secondary | ICD-10-CM

## 2019-06-11 ENCOUNTER — Other Ambulatory Visit: Payer: Self-pay | Admitting: Internal Medicine

## 2019-06-11 DIAGNOSIS — Z1231 Encounter for screening mammogram for malignant neoplasm of breast: Secondary | ICD-10-CM

## 2019-07-19 ENCOUNTER — Ambulatory Visit
Admission: RE | Admit: 2019-07-19 | Discharge: 2019-07-19 | Disposition: A | Discharge status: Home / Self Care | Payer: BC Managed Care – PPO | Source: Ambulatory Visit | Attending: Internal Medicine | Admitting: Internal Medicine

## 2019-07-19 ENCOUNTER — Other Ambulatory Visit: Payer: Self-pay

## 2019-07-19 DIAGNOSIS — Z1231 Encounter for screening mammogram for malignant neoplasm of breast: Secondary | ICD-10-CM

## 2021-07-07 ENCOUNTER — Other Ambulatory Visit: Payer: Self-pay | Admitting: Internal Medicine

## 2021-07-07 DIAGNOSIS — Z1231 Encounter for screening mammogram for malignant neoplasm of breast: Secondary | ICD-10-CM

## 2021-07-09 ENCOUNTER — Ambulatory Visit
Admission: RE | Admit: 2021-07-09 | Discharge: 2021-07-09 | Disposition: A | Discharge status: Home / Self Care | Payer: BC Managed Care – PPO | Source: Ambulatory Visit | Attending: Internal Medicine | Admitting: Internal Medicine

## 2021-07-09 DIAGNOSIS — Z1231 Encounter for screening mammogram for malignant neoplasm of breast: Secondary | ICD-10-CM

## 2021-07-10 ENCOUNTER — Other Ambulatory Visit: Payer: Self-pay | Admitting: Internal Medicine

## 2021-07-10 DIAGNOSIS — R928 Other abnormal and inconclusive findings on diagnostic imaging of breast: Secondary | ICD-10-CM

## 2021-07-22 ENCOUNTER — Ambulatory Visit: Payer: BC Managed Care – PPO | Admitting: Nurse Practitioner

## 2021-07-22 ENCOUNTER — Encounter: Payer: Self-pay | Admitting: Nurse Practitioner

## 2021-07-22 ENCOUNTER — Other Ambulatory Visit: Payer: Self-pay

## 2021-07-22 VITALS — BP 132/94 | HR 91 | Temp 97.5°F | Resp 14 | Ht 62.0 in | Wt 273.3 lb

## 2021-07-22 DIAGNOSIS — M25512 Pain in left shoulder: Secondary | ICD-10-CM | POA: Diagnosis not present

## 2021-07-22 DIAGNOSIS — F411 Generalized anxiety disorder: Secondary | ICD-10-CM

## 2021-07-22 DIAGNOSIS — Z0000 Encounter for general adult medical examination without abnormal findings: Secondary | ICD-10-CM | POA: Insufficient documentation

## 2021-07-22 DIAGNOSIS — R030 Elevated blood-pressure reading, without diagnosis of hypertension: Secondary | ICD-10-CM

## 2021-07-22 MED ORDER — CITALOPRAM HYDROBROMIDE 10 MG PO TABS: 10.0000 mg | ORAL_TABLET | Freq: Every day | ORAL | 2 refills | Status: DC

## 2021-07-22 MED ORDER — CYCLOBENZAPRINE HCL 5 MG PO TABS: 5.0000 mg | ORAL_TABLET | Freq: Every evening | ORAL | 0 refills | Status: DC | PRN

## 2021-07-22 NOTE — Progress Notes (Signed)
Established Patient Office Visit  Subjective:  Patient ID: Brianna Schneider, female    DOB: 1975-09-22  Age: 46 y.o. MRN: [id number]  CC:  Chief Complaint  Patient presents with   Transfer of Care   Shoulder Pain    Left shoulder, x 4 days. With movement pain is present. No injury to the area    HPI Albirtha Strollo presents for [facility] and CPE  for complete physical and follow up of chronic conditions.  Immunizations: -Tetanus: -Influenza: refused -Covid-19: Pfizer x2 -Shingles: NA -Pneumonia: NA   Diet: Fair diet. 3-5 meals a day. Soda, coffee, water, tea, juice. Depends on the day as to what she drinks Exercise: No regular exercise.  Eye exam: Completes annually, wears corrective lenses. last year was previous eye exam  Dental exam:  Needs updating  Pap Smear: Completed in unsure. Was followed by Dr. Zelphia Cairo off [address] Mammogram: Completed in 07/09/2021 has follow up at 07/27/2021 Colonoscopy: Completed in NA will like to have it done in [place] when time Dexa:NA  Lung Cancer Screening: NA  Sleep: goes to bed 10-11 and get up at 5 am. Feels rested sometimes. Does snore  Anxiety: patient was on citalopram. No SI/HI/AVH.  Shoulder: Left shoulder been hurting for approx 4 day. No known injury. Hurts when she moves it. Fine with sitting still. Has tried ibuprofen with some relief. No numbness or tingling or weakness   Past Medical History:  Diagnosis Date   Anxiety    GERD (gastroesophageal reflux disease)     Past Surgical History:  Procedure Laterality Date   CHOLECYSTECTOMY  2007   ERCP N/A 05/26/2015   Procedure: ENDOSCOPIC RETROGRADE CHOLANGIOPANCREATOGRAPHY (ERCP);  Surgeon: Jeani Hawking, MD;  Location: [facility] ENDOSCOPY;  Service: Endoscopy;  Laterality: N/A;   WISDOM TOOTH EXTRACTION      Family History  Problem Relation Age of Onset   Hypertension Mother    Arthritis Maternal Grandmother    Breast cancer Maternal  Grandmother        32    Social History   Socioeconomic History   Marital status: Married    Spouse name: Not on file   Number of children: 5   Years of education: Not on file   Highest education level: Not on file  Occupational History   Not on file  Tobacco Use   Smoking status: Never   Smokeless tobacco: Never  Substance and Sexual Activity   Alcohol use: Yes    Comment: occasional not even weekly   Drug use: No   Sexual activity: Yes    Birth control/protection: None  Other Topics Concern   Not on file  Social History Narrative   Fulltime: Production designer, theatre/television/film at [employer] [place]   Social Determinants of Health   Financial Resource Strain: Not on file  Food Insecurity: Not on file  Transportation Needs: Not on file  Physical Activity: Not on file  Stress: Not on file  Social Connections: Not on file  Intimate Partner Violence: Not on file    Outpatient Medications Prior to Visit  Medication Sig Dispense Refill   citalopram (CELEXA) 10 MG tablet TAKE 1 TABLET BY MOUTH  DAILY (Patient not taking: Reported on 07/22/2021) 90 tablet 2   cyclobenzaprine (FLEXERIL) 10 MG tablet Take 0.5-1 tablets (5-10 mg total) by mouth at bedtime. 30 tablet 0   methocarbamol (ROBAXIN) 500 MG tablet Take 1 tablet (500 mg total) by mouth at bedtime as needed for muscle spasms. (  Patient not taking: Reported on 05/15/2018) 20 tablet 0   predniSONE (DELTASONE) 20 MG tablet 2 tabs po for 7 days, then 1 tab po for 7 days 21 tablet 0   No facility-administered medications prior to visit.    No Known Allergies  ROS Review of Systems  Constitutional:  Negative for chills, fatigue and fever.  Respiratory:  Negative for cough and shortness of breath.   Cardiovascular:  Negative for chest pain and leg swelling.  Gastrointestinal:  Negative for abdominal pain, diarrhea, nausea and vomiting.       BM daily to every other day  Genitourinary:  Negative for difficulty urinating, dysuria,  hematuria, vaginal bleeding, vaginal discharge and vaginal pain.  Neurological:  Negative for dizziness, light-headedness and headaches.  Psychiatric/Behavioral:  Negative for hallucinations and suicidal ideas.      Objective:    Physical Exam Vitals and nursing note reviewed.  Constitutional:      Appearance: Normal appearance. She is obese.  HENT:     Right Ear: Tympanic membrane, ear canal and external ear normal.     Left Ear: Tympanic membrane, ear canal and external ear normal.     Mouth/Throat:     Mouth: Mucous membranes are moist.     Pharynx: Oropharynx is clear.  Eyes:     Extraocular Movements: Extraocular movements intact.     Pupils: Pupils are equal, round, and reactive to light.     Comments: Wears corrective lenses  Cardiovascular:     Rate and Rhythm: Normal rate and regular rhythm.  Pulmonary:     Effort: Pulmonary effort is normal.     Breath sounds: Normal breath sounds.  Abdominal:     General: Bowel sounds are normal. There is no distension.     Palpations: There is no mass.     Tenderness: There is no abdominal tenderness.     Hernia: No hernia is present.  Musculoskeletal:       Arms:     Right lower leg: No edema.     Left lower leg: No edema.     Comments: Negative empty can test Negative Hawkins Test Muscle tightness to upper left trap muscle  Lymphadenopathy:     Cervical: No cervical adenopathy.  Skin:    General: Skin is warm.  Neurological:     General: No focal deficit present.     Mental Status: She is alert.     Deep Tendon Reflexes:     Reflex Scores:      Bicep reflexes are 2+ on the right side and 2+ on the left side.      Patellar reflexes are 2+ on the right side and 2+ on the left side.    Comments: Bilateral upper and lower extremity strength 5/5  Psychiatric:        Mood and Affect: Mood normal.        Behavior: Behavior normal.        Thought Content: Thought content normal.        Judgment: Judgment normal.    BP  (!) 132/94   Pulse 91   Temp (!) 97.5 F (36.4 C)   Resp 14   Ht 5\' 2"  (1.575 m)   Wt 273 lb 5 oz (124 kg)   SpO2 98%   BMI 49.99 kg/m  Wt Readings from Last 3 Encounters:  07/22/21 273 lb 5 oz (124 kg)  05/15/18 253 lb (114.8 kg)  03/28/18 255 lb (115.7 kg)  Health Maintenance Due  Topic Date Due   HIV Screening  Never done   Hepatitis C Screening  Never done   PAP SMEAR-Modifier  09/30/2017   TETANUS/TDAP  12/07/2017   COVID-19 Vaccine (3 - Booster for Pfizer series) 11/25/2019    There are no preventive care reminders to display for this patient.  No results found for: TSH Lab Results  Component Value Date   WBC 8.3 03/22/2017   HGB 12.7 03/22/2017   HCT 38.5 03/22/2017   MCV 91.3 03/22/2017   PLT 253.0 03/22/2017   Lab Results  Component Value Date   NA 136 03/22/2017   K 3.6 03/22/2017   CO2 28 03/22/2017   GLUCOSE 93 03/22/2017   BUN 10 03/22/2017   CREATININE 0.65 03/22/2017   BILITOT 0.8 03/22/2017   ALKPHOS 64 03/22/2017   AST 21 03/22/2017   ALT 22 03/22/2017   PROT 7.6 03/22/2017   ALBUMIN 4.1 03/22/2017   CALCIUM 9.3 03/22/2017   ANIONGAP 8 05/27/2015   GFR 129.68 03/22/2017   Lab Results  Component Value Date   CHOL 196 03/22/2017   Lab Results  Component Value Date   HDL 54.70 03/22/2017   Lab Results  Component Value Date   LDLCALC 121 (H) 03/22/2017   Lab Results  Component Value Date   TRIG 103.0 03/22/2017   Lab Results  Component Value Date   CHOLHDL 4 03/22/2017   Lab Results  Component Value Date   HGBA1C 5.6 03/22/2017      Assessment & Plan:   Problem List Items Addressed This Visit       Other   GAD (generalized anxiety disorder)    PHQ-9 and GAD-7 administered in office.  Patient denies HI/SI/AVH.  We will start patient back on citalopram 10 mg daily.  She will update in 6 weeks.  Follow-up if no improvement      Relevant Medications   citalopram (CELEXA) 10 MG tablet   Acute pain of left shoulder  - Primary    No known injury.  Exam fairly benign did have some muscle tension we will treat her with Flexeril nightly as needed.  She can continue taking over-the-counter analgesics that proved to be beneficial.  Follow-up if no improvement if symptoms persist we can consider imaging at that point.      Relevant Medications   cyclobenzaprine (FLEXERIL) 5 MG tablet   Morbid obesity (HCC)    Discussed healthy lifestyle modifications inclusive of exercising 250 minutes a week and trying to follow a Mediterranean diet.      Relevant Orders   Hemoglobin A1c   TSH   Lipid panel   Preventative health care    Discussed age-appropriate screenings and immunizations.  Encouraged healthy lifestyle modifications      Relevant Orders   CBC with Differential/Platelet   Comprehensive metabolic panel   Hemoglobin A1c   TSH   Lipid panel   Ambulatory referral to Gynecology   Elevated BP without diagnosis of hypertension    Patient's original blood pressure was elevated in office visit.  Did trend down some after visit.  Did look back at last several visits over the years and her blood pressure was within normal limits.       Meds ordered this encounter  Medications   citalopram (CELEXA) 10 MG tablet    Sig: Take 1 tablet (10 mg total) by mouth daily.    Dispense:  90 tablet    Refill:  2  Please consider 90 day supplies to promote better adherence   cyclobenzaprine (FLEXERIL) 5 MG tablet    Sig: Take 1 tablet (5 mg total) by mouth at bedtime as needed for muscle spasms.    Dispense:  10 tablet    Refill:  0    Order Specific Question:   Supervising Provider    Answer:   Roxy Manns A [[id number]]    Follow-up: Return in about 1 year (around 07/23/2022) for cpe.   This visit occurred during the SARS-CoV-2 public health emergency.  Safety protocols were in place, including screening questions prior to the visit, additional usage of staff PPE, and extensive cleaning of exam room while  observing appropriate contact time as indicated for disinfecting solutions.   Audria Nine, NP

## 2021-07-22 NOTE — Patient Instructions (Signed)
Nice to see you today ?I will be in touch with the lab results once I have them ?Let me know how you are doing on the citalopram in about 6 weeks ?Work on starting an exercise program. They recommend 33mins a day for 5 days a week.  ?In regards to diet see attachment for recommendations ?Follow up with me in 1 year, sooner if you need me ?

## 2021-07-22 NOTE — Assessment & Plan Note (Signed)
PHQ-9 and GAD-7 administered in office.  Patient denies HI/SI/AVH.  We will start patient back on citalopram 10 mg daily.  She will update in 6 weeks.  Follow-up if no improvement ?

## 2021-07-22 NOTE — Assessment & Plan Note (Signed)
Discussed healthy lifestyle modifications inclusive of exercising 250 minutes a week and trying to follow a Mediterranean diet. ?

## 2021-07-22 NOTE — Assessment & Plan Note (Signed)
Patient's original blood pressure was elevated in office visit.  Did trend down some after visit.  Did look back at last several visits over the years and her blood pressure was within normal limits. ?

## 2021-07-22 NOTE — Assessment & Plan Note (Signed)
Discussed age-appropriate screenings and immunizations.  Encouraged healthy lifestyle modifications ?

## 2021-07-22 NOTE — Assessment & Plan Note (Signed)
No known injury.  Exam fairly benign did have some muscle tension we will treat her with Flexeril nightly as needed.  She can continue taking over-the-counter analgesics that proved to be beneficial.  Follow-up if no improvement if symptoms persist we can consider imaging at that point. ?

## 2021-07-23 LAB — COMPREHENSIVE METABOLIC PANEL
ALT: 33 U/L (ref 0–35)
AST: 28 U/L (ref 0–37)
Albumin: 4.5 g/dL (ref 3.5–5.2)
Alkaline Phosphatase: 92 U/L (ref 39–117)
BUN: 15 mg/dL (ref 6–23)
CO2: 30 mEq/L (ref 19–32)
Calcium: 10 mg/dL (ref 8.4–10.5)
Chloride: 100 mEq/L (ref 96–112)
Creatinine, Ser: 0.8 mg/dL (ref 0.40–1.20)
GFR: 89.51 mL/min (ref >60.00)
Glucose, Bld: 108 mg/dL — ABNORMAL HIGH (ref 70–99)
Potassium: 3.7 mEq/L (ref 3.5–5.1)
Sodium: 138 mEq/L (ref 135–145)
Total Bilirubin: 0.7 mg/dL (ref 0.2–1.2)
Total Protein: 7.9 g/dL (ref 6.0–8.3)

## 2021-07-23 LAB — CBC WITH DIFFERENTIAL/PLATELET
Basophils Absolute: 0.1 10*3/uL (ref 0.0–0.1)
Basophils Relative: 1.3 % (ref 0.0–3.0)
Eosinophils Absolute: 0.3 10*3/uL (ref 0.0–0.7)
Eosinophils Relative: 3 % (ref 0.0–5.0)
HCT: 37.1 % (ref 36.0–46.0)
Hemoglobin: 12.6 g/dL (ref 12.0–15.0)
Lymphocytes Relative: 37 % (ref 12.0–46.0)
Lymphs Abs: 3.9 10*3/uL (ref 0.7–4.0)
MCHC: 34.1 g/dL (ref 30.0–36.0)
MCV: 88.1 fl (ref 78.0–100.0)
Monocytes Absolute: 0.5 10*3/uL (ref 0.1–1.0)
Monocytes Relative: 4.8 % (ref 3.0–12.0)
Neutro Abs: 5.7 10*3/uL (ref 1.4–7.7)
Neutrophils Relative %: 53.9 % (ref 43.0–77.0)
Platelets: 247 10*3/uL (ref 150.0–400.0)
RBC: 4.21 Mil/uL (ref 3.87–5.11)
RDW: 14.4 % (ref 11.5–15.5)
WBC: 10.6 10*3/uL — ABNORMAL HIGH (ref 4.0–10.5)

## 2021-07-23 LAB — LIPID PANEL
Cholesterol: 212 mg/dL — ABNORMAL HIGH (ref 0–200)
HDL: 47.2 mg/dL (ref >39.00)
LDL Cholesterol: 126 mg/dL — ABNORMAL HIGH (ref 0–99)
NonHDL: 165.17
Total CHOL/HDL Ratio: 4
Triglycerides: 197 mg/dL — ABNORMAL HIGH (ref 0.0–149.0)
VLDL: 39.4 mg/dL (ref 0.0–40.0)

## 2021-07-23 LAB — TSH: TSH: 1.98 u[IU]/mL (ref 0.35–5.50)

## 2021-07-23 LAB — HEMOGLOBIN A1C: Hgb A1c MFr Bld: 6.1 % (ref 4.6–6.5)

## 2021-07-24 ENCOUNTER — Telehealth: Payer: Self-pay

## 2021-07-24 NOTE — Telephone Encounter (Signed)
called and LVM for the patient to call back for lab result. Nina,cma    Eden Emms, NP  07/23/2021  1:34 PM EDT     Can we review the labs with the patient please: Red and white blood cells look good. Liver, kidney, and thyroid functions are good. A1C is elevated at 6.1 which places you in the prediabetic range. Trying to follow a Mediterranean diet and working on physical activity will help this along with your cholesterol. Cholesterol is elevated LDL (bad Cholesterol) was 048 and we want it below 100. Nothing to be placed on medication for, just work on lifestyle modifications. Let me know if there are any questions   Thanks, Matt  Patient is returning call. Please advise?

## 2021-07-24 NOTE — Telephone Encounter (Signed)
Spoke with patient. See lab result notes. ?

## 2021-07-27 ENCOUNTER — Ambulatory Visit
Admission: RE | Admit: 2021-07-27 | Discharge: 2021-07-27 | Disposition: A | Discharge status: Home / Self Care | Payer: BC Managed Care – PPO | Source: Ambulatory Visit | Attending: Internal Medicine | Admitting: Internal Medicine

## 2021-07-27 ENCOUNTER — Other Ambulatory Visit: Payer: Self-pay | Admitting: Internal Medicine

## 2021-07-27 DIAGNOSIS — R928 Other abnormal and inconclusive findings on diagnostic imaging of breast: Secondary | ICD-10-CM

## 2021-07-27 DIAGNOSIS — N6310 Unspecified lump in the right breast, unspecified quadrant: Secondary | ICD-10-CM

## 2021-09-04 ENCOUNTER — Ambulatory Visit (INDEPENDENT_AMBULATORY_CARE_PROVIDER_SITE_OTHER): Payer: BC Managed Care – PPO | Admitting: Nurse Practitioner

## 2021-09-04 ENCOUNTER — Ambulatory Visit (INDEPENDENT_AMBULATORY_CARE_PROVIDER_SITE_OTHER)
Admission: RE | Admit: 2021-09-04 | Discharge: 2021-09-04 | Disposition: A | Discharge status: Home / Self Care | Payer: BC Managed Care – PPO | Source: Ambulatory Visit | Attending: Nurse Practitioner | Admitting: Nurse Practitioner

## 2021-09-04 ENCOUNTER — Encounter: Payer: Self-pay | Admitting: Nurse Practitioner

## 2021-09-04 VITALS — BP 122/84 | HR 92 | Temp 97.8°F | Ht 62.0 in | Wt 274.4 lb

## 2021-09-04 DIAGNOSIS — M25572 Pain in left ankle and joints of left foot: Secondary | ICD-10-CM

## 2021-09-04 NOTE — Progress Notes (Signed)
? ?  Acute Office Visit ? ?Subjective:  ? ?  ?Patient ID: Brianna Schneider, female    DOB: April 08, 1976, 46 y.o.   MRN: [id number] ? ?Chief Complaint  ?Patient presents with  ? Ankle Pain  ?  C/o L ankle pain/swelling.  Started 2 days ago.  Denies injury. Tried ice therapy and ibuprofen.  Today, pain is improved, able to put shoe on.   ? ? ? ?Patient is in today for Left ankle  ? ?Symptoms started on 2-3 days ago. No known injury. ?States that it hurts and is swelling some. Stats yesterday she could not put shoe on today she can. Pain with movement that is described as sharp stabbing. ?Has tried ice and ibuprofen that has helped for short period of time ?Never had gout ?States some improvement since starting  ? ?Review of Systems  ?Constitutional:  Negative for chills and fever.  ?Musculoskeletal:  Positive for joint pain. Negative for myalgias.  ?     Altered gait  ?Neurological:  Negative for weakness.  ? ? ?   ?Objective:  ?  ?BP 122/84   Pulse 92   Temp 97.8 ?F (36.6 ?C) (Temporal)   Ht 5\' 2"  (1.575 m)   Wt 274 lb 7 oz (124.5 kg)   SpO2 98%   BMI 50.20 kg/m?  ? ? ?Physical Exam ?Vitals and nursing note reviewed.  ?Constitutional:   ?   Appearance: Normal appearance. She is obese.  ?Cardiovascular:  ?   Rate and Rhythm: Normal rate and regular rhythm.  ?   Heart sounds: Normal heart sounds.  ?Pulmonary:  ?   Effort: Pulmonary effort is normal.  ?   Breath sounds: Normal breath sounds.  ?Musculoskeletal:     ?   General: No tenderness or signs of injury.  ?     Legs: ? ?   Comments: PROM and AROM full without pain ?No pain to palpation ?Pain with weight bearing  ?Neurological:  ?   Mental Status: She is alert.  ? ? ?No results found for any visits on 09/04/21. ? ? ?   ?Assessment & Plan:  ? ?Problem List Items Addressed This Visit   ? ?  ? Other  ? Acute left ankle pain - Primary  ?  No acute injury per patient report.  She is on her feet a lot with employment.  Cannot elicit the pain until she puts weight  and pressure upon it.  Passive and active range of motion good in office.  Did recommend resting compression and ice if that is beneficial did obtain x-ray, pending result.  Continue using anti-inflammatories.  Did offer patient a Toradol injection in office which she politely declined. ? ?  ?  ? Relevant Orders  ? DG Ankle Complete Left  ? ? ?No orders of the defined types were placed in this encounter. ? ? ?No follow-ups on file. ? ?Romilda Garret, NP ? ? ?

## 2021-09-04 NOTE — Patient Instructions (Signed)
Nice to see you today ?Continue using over the counter anti inflammatories. Aleve or naproxen works the best in my opinion ?Just follow the directions on the bottle ?I will let you know what the xray shows once I have the result ?Follow up if no improvement ? ?

## 2021-09-05 DIAGNOSIS — M25572 Pain in left ankle and joints of left foot: Secondary | ICD-10-CM | POA: Insufficient documentation

## 2021-09-05 NOTE — Assessment & Plan Note (Signed)
No acute injury per patient report.  She is on her feet a lot with employment.  Cannot elicit the pain until she puts weight and pressure upon it.  Passive and active range of motion good in office.  Did recommend resting compression and ice if that is beneficial did obtain x-ray, pending result.  Continue using anti-inflammatories.  Did offer patient a Toradol injection in office which she politely declined. ?

## 2021-11-05 ENCOUNTER — Telehealth: Payer: Self-pay | Admitting: Nurse Practitioner

## 2021-11-05 NOTE — Telephone Encounter (Signed)
Not able to leave a message. Will try again later today

## 2021-11-05 NOTE — Telephone Encounter (Signed)
Got notice from her insurance that she may not be taking the citalopram. Can we verify please

## 2021-11-05 NOTE — Telephone Encounter (Signed)
Mailbox was full not able to leave a message

## 2021-11-06 NOTE — Telephone Encounter (Signed)
Called patient's mom and left message asking for patient to call back

## 2021-11-09 NOTE — Telephone Encounter (Signed)
Spoke with patient. Patient states she takes Citalopram about 2 days a week-she takes it in the morning when she does take it. Patient states it makes her feels drowsy and hard to operate at work. Patient has not tried to take medication in the evening.

## 2021-11-09 NOTE — Telephone Encounter (Signed)
Taking the medication before bed is an option and may hep circumvent the day time sleepiness. The medication generally is not effective unless taken daily

## 2021-11-09 NOTE — Telephone Encounter (Signed)
Called but not able to leave a message

## 2021-11-11 NOTE — Telephone Encounter (Signed)
Left message to call back  

## 2021-11-16 NOTE — Telephone Encounter (Signed)
Left message to return call to our office.  

## 2021-11-19 NOTE — Telephone Encounter (Signed)
Left detailed message on patient's voicemail-ok per DPR on file 

## 2021-12-26 LAB — GLUCOSE, POCT (MANUAL RESULT ENTRY): POC Glucose: 101 mg/dl — AB (ref 70–99)

## 2022-01-28 ENCOUNTER — Other Ambulatory Visit: Payer: BC Managed Care – PPO

## 2022-02-15 ENCOUNTER — Ambulatory Visit: Payer: BC Managed Care – PPO

## 2022-02-26 ENCOUNTER — Telehealth: Payer: Self-pay | Admitting: Nurse Practitioner

## 2022-02-26 ENCOUNTER — Ambulatory Visit (INDEPENDENT_AMBULATORY_CARE_PROVIDER_SITE_OTHER): Payer: BC Managed Care – PPO | Admitting: Family Medicine

## 2022-02-26 ENCOUNTER — Encounter: Payer: Self-pay | Admitting: Family Medicine

## 2022-02-26 VITALS — BP 161/85 | HR 94 | Temp 98.0°F | Ht 62.0 in | Wt 260.1 lb

## 2022-02-26 DIAGNOSIS — H8110 Benign paroxysmal vertigo, unspecified ear: Secondary | ICD-10-CM

## 2022-02-26 DIAGNOSIS — R030 Elevated blood-pressure reading, without diagnosis of hypertension: Secondary | ICD-10-CM | POA: Diagnosis not present

## 2022-02-26 DIAGNOSIS — N912 Amenorrhea, unspecified: Secondary | ICD-10-CM | POA: Diagnosis not present

## 2022-02-26 MED ORDER — MECLIZINE HCL 25 MG PO TABS: 25.0000 mg | ORAL_TABLET | Freq: Three times a day (TID) | ORAL | 0 refills | Status: DC | PRN

## 2022-02-26 NOTE — Telephone Encounter (Addendum)
Merrilee Seashore RN with access nurse called and pt needs to be seen within 4 hrs. Offered appt at 12:30 pm today al [facility] but pt does not have way to come to office at that time due to dizziness and cannot drive herself; pt wants appt later today at [facility]; pt scheduled appt with Dr Glori Bickers 02/26/22 at 4 PM and pts daughter will drive pt. UC & ED precautions given and pt voiced understanding. Pt is going to rest and drink plenty of water. Pt is not on BP medication. Sending note to Dr Glori Bickers and [place] CMA.      [place] [facility] - Client TELEPHONE ADVICE RECORD AccessNurse Patient Name: Brianna Schneider [facility] Gender: Female DOB: [birth year] Age: 46 Y 2 M 2 D Return Phone Number: [phone] (Primary) Address: City/ State/ Zip: [place]  [address] Client [place] [facility] - Client Client Site [place] - Day Provider Romilda Garret- NP Contact Type Call Who Is Calling Patient / Member / Family / Caregiver Call Type Triage / Clinical Relationship To Patient Self Return Phone Number [phone] (Primary) Chief Complaint Anxiety and Panic Attack Reason for Call Symptomatic / Request for Health Information Initial Comment Caller states woke up around 2am startled; had some dizziness; anxiety elevated; but since BP has elevated, some dizziness; 192/96 at 7:39 186/92 hr later 185/92 10:05; dizziness not as bad; Translation No Nurse Assessment Nurse: Patsey Berthold, RN, Roma Kayser Date/Time (Eastern Time): 02/26/2022 10:53:56 AM Confirm and document reason for call. If symptomatic, describe symptoms. ---At 2 AM was startled awake, everything was spinning, felt off balance. Could not go back to sleep due to anxiety, when trying to go to work this AM felt dizzy. Right now dizziness is off and on, felt more when turning head, anxiety is still high, and now BP high. 192/96 at 7:39 186/92 hr later 185/92 10:05; dizziness not as bad; Does not take  BP meds- only take melatonin. Does the patient have any new or worsening symptoms? ---Yes Will a triage be completed? ---Yes Related visit to physician within the last 2 weeks? ---Yes Does the PT have any chronic conditions? (i.e. diabetes, asthma, this includes High risk factors for pregnancy, etc.) ---No Is the patient pregnant or possibly pregnant? (Ask all females between the ages of 72-55) ---No Is this a behavioral health or substance abuse call? ---No Guidelines Guideline Title Affirmed Question Affirmed Notes Nurse Date/Time (Eastern Time) Blood Pressure - High Systolic BP >= 702 OR Diastolic >= 637 Patsey Berthold, RN, Roma Kayser 02/26/2022 11:00:53 AM PLEASE NOTE: All timestamps contained within this report are represented as [place] Standard Time. CONFIDENTIALTY NOTICE: This fax transmission is intended only for the addressee. It contains information that is legally privileged, confidential or otherwise protected from use or disclosure. If you are not the intended recipient, you are strictly prohibited from reviewing, disclosing, copying using or disseminating any of this information or taking any action in reliance on or regarding this information. If you have received this fax in error, please notify us immediately by telephone so that we can arrange for its return to [place]. Phone: [phone], Toll-Free: [phone], Fax: [phone] Page: 2 of 2 Call Id: [id number] [place]. Time Eilene Ghazi Time) Disposition Final User 02/26/2022 11:05:11 AM See PCP within 24 Hours Yes Patsey Berthold RN, Roma Kayser Final Disposition 02/26/2022 11:05:11 AM See PCP within 24 Hours Yes Patsey Berthold, RN, Melvyn Novas Disagree/Comply Comply Caller Understands Yes PreDisposition Call Doctor Care Advice Given Per Guideline CALL BACK IF: *  Weakness or numbness of the face, arm or leg on one side of the body occurs * Difficulty walking, difficulty talking, or severe headache occurs * Chest pain or difficulty  breathing occurs * You become worse CARE ADVICE given per High Blood Pressure (Adult) guideline. SEE PCP WITHIN 24 HOURS: * IF OFFICE WILL BE OPEN: You need to be examined within the next 24 hours. Call your doctor (or NP/PA) when the office opens and make an appointment. Comments User: Albin Fischer Date/Time Lamount Cohen Time): 02/26/2022 10:42:48 AM Fredrich Romans provided info; User: Christean Grief, RN Date/Time Lamount Cohen Time): 02/26/2022 10:57:19 AM Last MP in July last year. User: Christean Grief, RN Date/Time Lamount Cohen Time): 02/26/2022 10:59:04 AM Anxious from unknown etiology of symptoms. User: Christean Grief, RN Date/Time Lamount Cohen Time): 02/26/2022 11:11:44 AM Caller agrees to appointment at 4PM. User: Christean Grief, RN Date/Time Lamount Cohen Time): 02/26/2022 11:13:16 AM Caller explains that she does not feel safe to drive and will have daughter drive her when she gets off work. She asks for later appointment- scheduled for 4 PM with different provider. Referrals REFERRED TO PCP OFFIC

## 2022-02-26 NOTE — Telephone Encounter (Signed)
Patient called in stating that she was awaken out of her sleep this morning. She was a little dizzy when she woke up. Now that she is at work she is experiencing a little dizziness,but her blood pressure is elevated and her anxiety as well. I sent her to be triaged since her symptoms start this morning.

## 2022-02-26 NOTE — Patient Instructions (Addendum)
Try the meclizine 25 mg up to every 8 hours as needed for for dizziness   Move head slowly  No sudden movements  Drink fluids / water   Ease up on the sodium in your diet  eat Fruits/veggies and lean protein   Less processed food if you can /anything salty try to avoid   I think the dizziness made your anxiety worse and your blood pressure high  We need to see how your blood pressure is when you feel normal to see if you need to start medicine     If you get suddenly worse or have a bad headache or other new symptoms please go to the closest ER

## 2022-02-26 NOTE — Progress Notes (Unsigned)
Subjective:    Patient ID: Brianna Schneider, female    DOB: [birth year], 46 y.o.   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 and elevated bp at home  Wt Readings from Last 3 Encounters:  02/26/22 260 lb 2 oz (118 kg)  09/04/21 274 lb 7 oz (124.5 kg)  07/22/21 273 lb 5 oz (124 kg)   47.58 kg/m  Dizziness started at 2 am  Woke up in a panic and jumped up and felt like the room was spinning  Tried to get to the bathroom to get some water  Tried to get back to sleep but felt anxious  Raised her head on pillow Dozed off at 4 am  Got up after 5 - less dizzy than she was  Checked bp at work and it was very high 190s/90s   Went down a little to 180s/90s at 10 am   Now: Dizziness is improved-able to walk a lot better  Is dizzy if she turns her head too quickly   No headache  No nasal symptoms (aside from normal amount allergy congestion)  Ears do not hurt- a week ago one of them hurt  No headache  No cp or sob     BP Readings from Last 3 Encounters:  02/26/22 (!) 170/94  12/26/21 (!) 134/92  09/04/21 122/84   Pulse Readings from Last 3 Encounters:  02/26/22 94  09/04/21 92  07/22/21 91   Fam h/o HTN in mother    GAD takes citalopram 10 mg daily   Lab Results  Component Value Date   CREATININE 0.80 07/22/2021   BUN 15 07/22/2021   NA 138 07/22/2021   K 3.7 07/22/2021   CL 100 07/22/2021   CO2 30 07/22/2021   Lab Results  Component Value Date   ALT 33 07/22/2021   AST 28 07/22/2021   ALKPHOS 92 07/22/2021   BILITOT 0.7 07/22/2021   Lab Results  Component Value Date   WBC 10.6 (H) 07/22/2021   HGB 12.6 07/22/2021   HCT 37.1 07/22/2021   MCV 88.1 07/22/2021   PLT 247.0 07/22/2021   Lab Results  Component Value Date   TSH 1.98 07/22/2021   Contraception - not needed /pt has not had period for years  Fam h/o early menopause (in early 26s)  Patient Active Problem List   Diagnosis Date Noted   Amenorrhea 02/28/2022    BPV (benign positional vertigo) 02/26/2022   Acute left ankle pain 09/05/2021   Acute pain of left shoulder 07/22/2021   Morbid obesity (HCC) 07/22/2021   Preventative health care 07/22/2021   Elevated BP without diagnosis of hypertension 07/22/2021   Esophageal reflux    GAD (generalized anxiety disorder) 04/24/2015   Past Medical History:  Diagnosis Date   Anxiety    GERD (gastroesophageal reflux disease)    Past Surgical History:  Procedure Laterality Date   CHOLECYSTECTOMY  2007   ERCP N/A 05/26/2015   Procedure: ENDOSCOPIC RETROGRADE CHOLANGIOPANCREATOGRAPHY (ERCP);  Surgeon: Jeani Hawking, MD;  Location: [facility] ENDOSCOPY;  Service: Endoscopy;  Laterality: N/A;   WISDOM TOOTH EXTRACTION     Social History   Tobacco Use   Smoking status: Never   Smokeless tobacco: Never  Substance Use Topics   Alcohol use: Yes    Comment: occasional not even weekly   Drug use: No   Family History  Problem Relation Age of Onset   Hypertension Mother    Arthritis Maternal Grandmother  Breast cancer Maternal Grandmother        80   No Known Allergies Current Outpatient Medications on File Prior to Visit  Medication Sig Dispense Refill   citalopram (CELEXA) 10 MG tablet Take 1 tablet (10 mg total) by mouth daily. 90 tablet 2   No current facility-administered medications on file prior to visit.    Review of Systems  Constitutional:  Positive for fatigue. Negative for activity change, appetite change, fever and unexpected weight change.  HENT:  Positive for congestion. Negative for ear pain, rhinorrhea, sinus pressure and sore throat.   Eyes:  Negative for pain, redness and visual disturbance.  Respiratory:  Negative for cough, shortness of breath and wheezing.   Cardiovascular:  Negative for chest pain and palpitations.  Gastrointestinal:  Negative for abdominal pain, blood in stool, constipation and diarrhea.  Endocrine: Negative for polydipsia and polyuria.  Genitourinary:  Negative  for dysuria, frequency and urgency.       No menses for years   Musculoskeletal:  Negative for arthralgias, back pain and myalgias.  Skin:  Negative for pallor and rash.  Allergic/Immunologic: Negative for environmental allergies.  Neurological:  Positive for dizziness. Negative for tremors, seizures, syncope, facial asymmetry, speech difficulty, weakness, numbness and headaches.  Hematological:  Negative for adenopathy. Does not bruise/bleed easily.  Psychiatric/Behavioral:  Negative for decreased concentration and dysphoric mood. The patient is not nervous/anxious.        Objective:   Physical Exam Constitutional:      General: She is not in acute distress.    Appearance: Normal appearance. She is well-developed. She is obese. She is not ill-appearing or diaphoretic.  HENT:     Head: Normocephalic and atraumatic.     Right Ear: Ear canal and external ear normal. There is no impacted cerumen.     Left Ear: Ear canal and external ear normal. There is no impacted cerumen.     Ears:     Comments: TMs are dull bilaterally    Nose:     Comments: Nares are boggy    Mouth/Throat:     Mouth: Mucous membranes are moist.  Eyes:     General: No scleral icterus.    Conjunctiva/sclera: Conjunctivae normal.     Pupils: Pupils are equal, round, and reactive to light.     Comments: Horizontal nystagmus bilaterally 2-3 beats   Neck:     Thyroid: No thyromegaly.     Vascular: No carotid bruit or JVD.  Cardiovascular:     Rate and Rhythm: Normal rate and regular rhythm.     Heart sounds: Normal heart sounds.     No gallop.  Pulmonary:     Effort: Pulmonary effort is normal. No respiratory distress.     Breath sounds: Normal breath sounds. No wheezing or rales.  Abdominal:     General: There is no distension or abdominal bruit.     Palpations: Abdomen is soft.  Musculoskeletal:     Cervical back: Normal range of motion and neck supple.     Right lower leg: No edema.     Left lower leg: No  edema.  Lymphadenopathy:     Cervical: No cervical adenopathy.  Skin:    General: Skin is warm and dry.     Coloration: Skin is not jaundiced or pale.     Findings: No rash.  Neurological:     Mental Status: She is alert.     Cranial Nerves: No cranial nerve deficit, dysarthria or facial  asymmetry.     Sensory: No sensory deficit.     Motor: No weakness, tremor, abnormal muscle tone or pronator drift.     Coordination: Coordination is intact. Romberg sign negative. Coordination normal. Finger-Nose-Finger Test normal.     Gait: Gait is intact. Gait normal.     Deep Tendon Reflexes: Reflexes are normal and symmetric. Reflexes normal.     Comments: Moving slowly  Rapid head movement causes dizziness  Psychiatric:        Mood and Affect: Mood normal.           Assessment & Plan:   Problem List Items Addressed This Visit       Nervous and Auditory   BPV (benign positional vertigo) - Primary    Started this am after sitting up quickly in bed , now much improved  Reassuring exam  Elevated bp may be from anxiety and dizziness and appears to be coming down here  Disc ER precautions  Px meclizine to use prn Handout given re: vertigo and the epley maneuver  Cautioned to change positions slowly  If congested- steroid ns may help  Update if not starting to improve in a week or if worsening   Plan close f/u with pcp        Other   Amenorrhea    Pt states no period for years and a family h/o early menopause in 28s  She has hot flashes and assumes menopausal  Urged her to address with pcp  In light of obesity would want to confirm no endometrial hyperplasia       Elevated BP without diagnosis of hypertension    This was up more today likely due to anxiety and a spell of vertigo  Is improving here  Will follow closely and plan f/u with pcp  Nl neuro exam today   Given history and family history she may need tx for new HTN  Reviewed pt's record /labs and family history  today  Discussed lifestyle change  Discussed lower na diet (less processed foods) Planning follow up next wk       Morbid obesity ([place])    Discussed how this problem influences overall health and the risks it imposes  Reviewed plan for weight loss with lower calorie diet (via better food choices and also portion control or program like weight watchers) and exercise building up to or more than 30 minutes 5 days per week including some aerobic activity

## 2022-02-28 DIAGNOSIS — N912 Amenorrhea, unspecified: Secondary | ICD-10-CM | POA: Insufficient documentation

## 2022-02-28 NOTE — Assessment & Plan Note (Signed)
Pt states no period for years and a family h/o early menopause in 41s  She has hot flashes and assumes menopausal  Urged her to address with pcp  In light of obesity would want to confirm no endometrial hyperplasia

## 2022-02-28 NOTE — Assessment & Plan Note (Signed)
Discussed how this problem influences overall health and the risks it imposes  Reviewed plan for weight loss with lower calorie diet (via better food choices and also portion control or program like weight watchers) and exercise building up to or more than 30 minutes 5 days per week including some aerobic activity    

## 2022-02-28 NOTE — Assessment & Plan Note (Signed)
This was up more today likely due to anxiety and a spell of vertigo  Is improving here  Will follow closely and plan f/u with pcp  Nl neuro exam today   Given history and family history she may need tx for new HTN  Reviewed pt's record /labs and family history today  Discussed lifestyle change  Discussed lower na diet (less processed foods) Planning follow up next wk

## 2022-02-28 NOTE — Assessment & Plan Note (Signed)
Started this am after sitting up quickly in bed , now much improved  Reassuring exam  Elevated bp may be from anxiety and dizziness and appears to be coming down here  Disc ER precautions  Px meclizine to use prn Handout given re: vertigo and the epley maneuver  Cautioned to change positions slowly  If congested- steroid ns may help  Update if not starting to improve in a week or if worsening   Plan close f/u with pcp

## 2022-03-18 ENCOUNTER — Ambulatory Visit (INDEPENDENT_AMBULATORY_CARE_PROVIDER_SITE_OTHER): Payer: BC Managed Care – PPO | Admitting: Nurse Practitioner

## 2022-03-18 VITALS — BP 130/82 | HR 91 | Temp 97.1°F | Resp 10 | Ht 62.0 in | Wt 260.5 lb

## 2022-03-18 DIAGNOSIS — R030 Elevated blood-pressure reading, without diagnosis of hypertension: Secondary | ICD-10-CM | POA: Diagnosis not present

## 2022-03-18 DIAGNOSIS — R42 Dizziness and giddiness: Secondary | ICD-10-CM

## 2022-03-18 DIAGNOSIS — N912 Amenorrhea, unspecified: Secondary | ICD-10-CM

## 2022-03-18 NOTE — Assessment & Plan Note (Signed)
Blood pressure within normal limits during today's office visit.  She has had back-and-forth with above goal and at goal.  We will continue to watch and defer medication currently

## 2022-03-18 NOTE — Progress Notes (Signed)
   Established Patient Office Visit  Subjective   Patient ID: Brianna Schneider, female    DOB: 1975-05-24  Age: 46 y.o. MRN: [id number]  Chief Complaint  Patient presents with   Hypertension    Follow up-not checking b/p at home. Dizziness is better    Hot Flashes    Has not had menstrual cycle since 11/2020. Wonders if she is in menopause now      Dizziness: Has improved. States that she has taken the meclizine at night. No more sense of dizziness.  Patient was given information on Epley maneuvers but has not had to use them per her report.  HTN: Patient has had office visits with blood pressure within normal limits in 2 office visits with blood pressures above limit.  Patient's blood pressure within normal limits today.   Amenorrhea: States last period of July 2022. States that no big changes. States that she will have night sweats some of the times. Does not wake her up. States that her mom went through the change early. States that she started her periods around 69.    Review of Systems  Constitutional:  Negative for chills and fever.  Respiratory:  Negative for shortness of breath.   Cardiovascular:  Negative for chest pain.  Genitourinary:        No abnormal vaginal bleeding, discharge, or pain   Neurological:  Negative for headaches.      Objective:     BP 130/82   Pulse 91   Temp (!) 97.1 F (36.2 C)   Resp 10   Ht 5\' 2"  (1.575 m)   Wt 260 lb 8 oz (118.2 kg)   SpO2 98%   BMI 47.65 kg/m    Physical Exam Vitals and nursing note reviewed.  Constitutional:      Appearance: Normal appearance.  Cardiovascular:     Rate and Rhythm: Normal rate and regular rhythm.     Heart sounds: Normal heart sounds.  Pulmonary:     Effort: Pulmonary effort is normal.     Breath sounds: Normal breath sounds.  Neurological:     Mental Status: She is alert.      No results found for any visits on 03/18/22.    The 10-year ASCVD risk score (Arnett DK, et al.,  2019) is: 1.6%    Assessment & Plan:   Problem List Items Addressed This Visit       Other   Elevated BP without diagnosis of hypertension    Blood pressure within normal limits during today's office visit.  She has had back-and-forth with above goal and at goal.  We will continue to watch and defer medication currently      Amenorrhea    Patient is established with GYN.  Did discuss possibilities of her going through menopause currently told her I could draw hormone levels but they are nonspecific.  We will have her follow-up with her GYN for further evaluation.      Dizziness - Primary    Was seen by colleague and diagnosed with benign positional vertigo.  This has resolved patient has had no more issues.  She has used meclizine a couple times at that time.  Stable at office visit       Return in about 4 months (around 07/17/2022).    09/16/2022, NP

## 2022-03-18 NOTE — Assessment & Plan Note (Signed)
Patient is established with GYN.  Did discuss possibilities of her going through menopause currently told her I could draw hormone levels but they are nonspecific.  We will have her follow-up with her GYN for further evaluation.

## 2022-03-18 NOTE — Assessment & Plan Note (Signed)
Was seen by colleague and diagnosed with benign positional vertigo.  This has resolved patient has had no more issues.  She has used meclizine a couple times at that time.  Stable at office visit

## 2022-03-18 NOTE — Patient Instructions (Addendum)
Nice to see you today Reach out to your gyn about the absence of periods I want to see you in approx 4 months for your physical, sooner if you need me

## 2022-03-24 ENCOUNTER — Ambulatory Visit
Admission: RE | Admit: 2022-03-24 | Discharge: 2022-03-24 | Disposition: A | Discharge status: Home / Self Care | Payer: BC Managed Care – PPO | Source: Ambulatory Visit | Attending: Internal Medicine | Admitting: Internal Medicine

## 2022-03-24 ENCOUNTER — Other Ambulatory Visit: Payer: Self-pay | Admitting: Internal Medicine

## 2022-03-24 DIAGNOSIS — N6310 Unspecified lump in the right breast, unspecified quadrant: Secondary | ICD-10-CM

## 2022-07-19 ENCOUNTER — Ambulatory Visit: Payer: BC Managed Care – PPO | Admitting: Nurse Practitioner

## 2022-07-21 ENCOUNTER — Encounter: Payer: Self-pay | Admitting: Nurse Practitioner

## 2022-09-14 ENCOUNTER — Telehealth: Payer: Self-pay | Admitting: Nurse Practitioner

## 2022-09-14 ENCOUNTER — Other Ambulatory Visit: Payer: Self-pay

## 2022-09-14 DIAGNOSIS — F411 Generalized anxiety disorder: Secondary | ICD-10-CM

## 2022-09-14 NOTE — Telephone Encounter (Signed)
Prescription Request  09/14/2022  LOV: 03/18/2022  What is the name of the medication or equipment? citalopram (CELEXA) 10 MG tablet   Have you contacted your pharmacy to request a refill? No   Which pharmacy would you like this sent to?  [facility] - [place] ([place]), [place] - [address] [place] ([place]) [place] [address] Phone: [phone] Fax: [phone]    Patient notified that their request is being sent to the clinical staff for review and that they should receive a response within 2 business days.   Please advise at Mobile [phone] (mobile)

## 2022-09-14 NOTE — Telephone Encounter (Signed)
Left message to return call to our office.  Pt needs an appointment for medication refill.

## 2022-09-14 NOTE — Telephone Encounter (Signed)
  citalopram (CELEXA) 10 MG tablet  LOV 03/18/2022 NOV

## 2022-09-21 NOTE — Telephone Encounter (Signed)
Pt overdue for appointment. Was supposed to come to office in March

## 2022-09-22 ENCOUNTER — Other Ambulatory Visit: Payer: BC Managed Care – PPO

## 2022-09-22 ENCOUNTER — Other Ambulatory Visit: Payer: Self-pay | Admitting: Nurse Practitioner

## 2022-09-22 DIAGNOSIS — F411 Generalized anxiety disorder: Secondary | ICD-10-CM

## 2022-09-27 NOTE — Telephone Encounter (Signed)
Patient has been scheduled

## 2022-09-29 ENCOUNTER — Ambulatory Visit (INDEPENDENT_AMBULATORY_CARE_PROVIDER_SITE_OTHER): Payer: BC Managed Care – PPO | Admitting: Nurse Practitioner

## 2022-09-29 ENCOUNTER — Encounter: Payer: Self-pay | Admitting: Nurse Practitioner

## 2022-09-29 VITALS — BP 118/70 | HR 90 | Temp 98.0°F | Resp 16 | Ht 62.0 in | Wt 267.0 lb

## 2022-09-29 DIAGNOSIS — R8290 Unspecified abnormal findings in urine: Secondary | ICD-10-CM | POA: Diagnosis not present

## 2022-09-29 DIAGNOSIS — R7303 Prediabetes: Secondary | ICD-10-CM | POA: Insufficient documentation

## 2022-09-29 DIAGNOSIS — F411 Generalized anxiety disorder: Secondary | ICD-10-CM

## 2022-09-29 DIAGNOSIS — F418 Other specified anxiety disorders: Secondary | ICD-10-CM | POA: Diagnosis not present

## 2022-09-29 DIAGNOSIS — R350 Frequency of micturition: Secondary | ICD-10-CM

## 2022-09-29 LAB — COMPREHENSIVE METABOLIC PANEL
ALT: 38 U/L — ABNORMAL HIGH (ref 0–35)
AST: 43 U/L — ABNORMAL HIGH (ref 0–37)
Albumin: 4 g/dL (ref 3.5–5.2)
Alkaline Phosphatase: 79 U/L (ref 39–117)
BUN: 11 mg/dL (ref 6–23)
CO2: 29 mEq/L (ref 19–32)
Calcium: 9.5 mg/dL (ref 8.4–10.5)
Chloride: 100 mEq/L (ref 96–112)
Creatinine, Ser: 0.81 mg/dL (ref 0.40–1.20)
GFR: 87.46 mL/min (ref >60.00)
Glucose, Bld: 89 mg/dL (ref 70–99)
Potassium: 4.3 mEq/L (ref 3.5–5.1)
Sodium: 136 mEq/L (ref 135–145)
Total Bilirubin: 0.9 mg/dL (ref 0.2–1.2)
Total Protein: 7 g/dL (ref 6.0–8.3)

## 2022-09-29 LAB — CBC
HCT: 38.2 % (ref 36.0–46.0)
Hemoglobin: 12.2 g/dL (ref 12.0–15.0)
MCHC: 32.1 g/dL (ref 30.0–36.0)
MCV: 90.7 fl (ref 78.0–100.0)
Platelets: 222 10*3/uL (ref 150.0–400.0)
RBC: 4.21 Mil/uL (ref 3.87–5.11)
RDW: 14.3 % (ref 11.5–15.5)
WBC: 7.2 10*3/uL (ref 4.0–10.5)

## 2022-09-29 LAB — POC URINALSYSI DIPSTICK (AUTOMATED)
Bilirubin, UA: NEGATIVE
Blood, UA: NEGATIVE
Glucose, UA: NEGATIVE
Ketones, UA: NEGATIVE
Leukocytes, UA: NEGATIVE
Nitrite, UA: NEGATIVE
Protein, UA: NEGATIVE
Spec Grav, UA: 1.02 (ref 1.010–1.025)
Urobilinogen, UA: 1 E.U./dL
pH, UA: 6 (ref 5.0–8.0)

## 2022-09-29 LAB — TSH: TSH: 1.93 u[IU]/mL (ref 0.35–5.50)

## 2022-09-29 LAB — HEMOGLOBIN A1C: Hgb A1c MFr Bld: 5.5 % (ref 4.6–6.5)

## 2022-09-29 MED ORDER — SULFAMETHOXAZOLE-TRIMETHOPRIM 800-160 MG PO TABS
1.0000 | ORAL_TABLET | Freq: Two times a day (BID) | ORAL | 0 refills | Status: DC
Start: 2022-09-29 — End: 2023-07-19

## 2022-09-29 MED ORDER — HYDROXYZINE HCL 10 MG PO TABS
10.0000 mg | ORAL_TABLET | Freq: Three times a day (TID) | ORAL | 0 refills | Status: DC | PRN
Start: 2022-09-29 — End: 2023-01-07

## 2022-09-29 NOTE — Patient Instructions (Signed)
Nice to see you today I have sent in antibiotics to the pharmacy and the new as needed anxiety pill Follow up in 3-4 months for your physical

## 2022-09-29 NOTE — Assessment & Plan Note (Signed)
UA in office 

## 2022-09-29 NOTE — Assessment & Plan Note (Signed)
Elect to treat for presumed urinary tract infection with Bactrim DS 1 tab twice daily for 3 days.  Pending urinary culture

## 2022-09-29 NOTE — Assessment & Plan Note (Signed)
Patient was prescribed citalopram.  States she has anxiety infrequently and knows she has to take the medication every day as she does not want to do that.  Will discontinue citalopram and start patient on hydroxyzine 10 mg 3 times daily as needed sedation precautions reviewed

## 2022-09-29 NOTE — Assessment & Plan Note (Signed)
History of the same check A1c while drawing labs today

## 2022-09-29 NOTE — Progress Notes (Signed)
Established Patient Office Visit  Subjective   Patient ID: Brianna Schneider, female    DOB: [birth year]  Age: 47 y.o. MRN: [id number]  Chief Complaint  Patient presents with   Medication Refill   Urinary Frequency    X 2 days       Urinary symptoms: states that it started on Monday. States the first time she went to the bathroom. States that she has the feeling of needing to go but not going and then tingling at the end  No vaginal itching or discharge   GAD: states that she is on celexa. States hat she does not want to take it everyday as she does not have issues with her anxiety daily. States that over a 2 week period it can happen approx 2. States that she has been off the medication for approx 1 week now       Review of Systems  Constitutional:  Negative for chills and fever.  Respiratory:  Negative for shortness of breath.   Cardiovascular:  Negative for chest pain.  Gastrointestinal:  Negative for abdominal pain, diarrhea, nausea and vomiting.  Genitourinary:  Positive for frequency. Negative for dysuria, flank pain, hematuria and urgency.  Neurological:  Negative for headaches.  Psychiatric/Behavioral:  Negative for hallucinations and suicidal ideas.       Objective:     BP 118/70   Pulse 90   Temp 98 F (36.7 C)   Resp 16   Ht 5\' 2"  (1.575 m)   Wt 267 lb (121.1 kg)   SpO2 99%   BMI 48.83 kg/m    Physical Exam Vitals and nursing note reviewed.  Constitutional:      Appearance: Normal appearance.  Cardiovascular:     Rate and Rhythm: Normal rate and regular rhythm.     Heart sounds: Normal heart sounds.  Pulmonary:     Effort: Pulmonary effort is normal.     Breath sounds: Normal breath sounds.  Abdominal:     General: Bowel sounds are normal. There is no distension.     Palpations: There is no mass.     Tenderness: There is no abdominal tenderness. There is no right CVA tenderness or left CVA tenderness.     Hernia: No hernia is present.   Neurological:     Mental Status: She is alert.      Results for orders placed or performed in visit on 09/29/22  POCT Urinalysis Dipstick (Automated)  Result Value Ref Range   Color, UA Yellow    Clarity, UA cloudy    Glucose, UA Negative Negative   Bilirubin, UA Negative    Ketones, UA Negative    Spec Grav, UA 1.020 1.010 - 1.025   Blood, UA Negative    pH, UA 6.0 5.0 - 8.0   Protein, UA Negative Negative   Urobilinogen, UA 1.0 0.2 or 1.0 E.U./dL   Nitrite, UA Negative    Leukocytes, UA Negative Negative      The 10-year ASCVD risk score (Arnett DK, et al., 2019) is: 1%    Assessment & Plan:   Problem List Items Addressed This Visit       Other   GAD (generalized anxiety disorder)   Relevant Medications   hydrOXYzine (ATARAX) 10 MG tablet   Prediabetes    History of the same check A1c while drawing labs today      Relevant Orders   Hemoglobin A1c   Situational anxiety    Patient was prescribed citalopram.  States she has anxiety infrequently and knows she has to take the medication every day as she does not want to do that.  Will discontinue citalopram and start patient on hydroxyzine 10 mg 3 times daily as needed sedation precautions reviewed      Relevant Medications   hydrOXYzine (ATARAX) 10 MG tablet   Other Relevant Orders   CBC   Comprehensive metabolic panel   TSH   Urination frequency - Primary    UA in office      Relevant Medications   sulfamethoxazole-trimethoprim (BACTRIM DS) 800-160 MG tablet   Other Relevant Orders   POCT Urinalysis Dipstick (Automated) (Completed)   Urine Culture   Abnormal urinalysis    Elect to treat for presumed urinary tract infection with Bactrim DS 1 tab twice daily for 3 days.  Pending urinary culture       Return in about 3 months (around 12/30/2022) for CPE and Labs.    Audria Nine, NP

## 2022-10-01 LAB — URINE CULTURE
MICRO NUMBER:: 14990295
SPECIMEN QUALITY:: ADEQUATE

## 2023-01-07 ENCOUNTER — Telehealth: Payer: Self-pay | Admitting: Nurse Practitioner

## 2023-01-07 DIAGNOSIS — F418 Other specified anxiety disorders: Secondary | ICD-10-CM

## 2023-01-07 MED ORDER — HYDROXYZINE HCL 10 MG PO TABS: 10.0000 mg | ORAL_TABLET | Freq: Three times a day (TID) | ORAL | 2 refills | Status: DC | PRN

## 2023-01-07 NOTE — Telephone Encounter (Signed)
Prescription Request  01/07/2023  LOV: 09/29/2022  What is the name of the medication or equipment? hydrOXYzine (ATARAX) 10 MG tablet   Have you contacted your pharmacy to request a refill? No   Which pharmacy would you like this sent to?  [facility] - [place] ([place]), [place] - [address] [place] ([place]) [place] [address] Phone: [phone] Fax: [phone]    Patient notified that their request is being sent to the clinical staff for review and that they should receive a response within 2 business days.   Please advise at Mobile [phone] (mobile)

## 2023-01-07 NOTE — Telephone Encounter (Signed)
LAST APPOINTMENT DATE: 09/29/2022   NEXT APPOINTMENT DATE: Visit date not found  Atarax 10mg   LAST REFILL: 09/29/2022  QTY: #30 0RF

## 2023-01-07 NOTE — Telephone Encounter (Signed)
Refill provided

## 2023-07-04 ENCOUNTER — Other Ambulatory Visit: Payer: Self-pay | Admitting: Nurse Practitioner

## 2023-07-04 DIAGNOSIS — N6310 Unspecified lump in the right breast, unspecified quadrant: Secondary | ICD-10-CM

## 2023-07-05 ENCOUNTER — Other Ambulatory Visit: Payer: Self-pay | Admitting: Nurse Practitioner

## 2023-07-05 DIAGNOSIS — F418 Other specified anxiety disorders: Secondary | ICD-10-CM

## 2023-07-19 ENCOUNTER — Encounter: Payer: Self-pay | Admitting: Family

## 2023-07-19 ENCOUNTER — Ambulatory Visit: Admitting: Family

## 2023-07-19 VITALS — BP 142/90 | HR 80 | Temp 97.7°F | Ht 62.0 in | Wt 276.4 lb

## 2023-07-19 DIAGNOSIS — R7303 Prediabetes: Secondary | ICD-10-CM

## 2023-07-19 DIAGNOSIS — R42 Dizziness and giddiness: Secondary | ICD-10-CM

## 2023-07-19 DIAGNOSIS — F411 Generalized anxiety disorder: Secondary | ICD-10-CM | POA: Diagnosis not present

## 2023-07-19 DIAGNOSIS — R7989 Other specified abnormal findings of blood chemistry: Secondary | ICD-10-CM

## 2023-07-19 DIAGNOSIS — R030 Elevated blood-pressure reading, without diagnosis of hypertension: Secondary | ICD-10-CM | POA: Diagnosis not present

## 2023-07-19 LAB — CBC
HCT: 39.8 % (ref 36.0–46.0)
Hemoglobin: 13.3 g/dL (ref 12.0–15.0)
MCHC: 33.5 g/dL (ref 30.0–36.0)
MCV: 89.8 fl (ref 78.0–100.0)
Platelets: 236 10*3/uL (ref 150.0–400.0)
RBC: 4.43 Mil/uL (ref 3.87–5.11)
RDW: 13.9 % (ref 11.5–15.5)
WBC: 7.5 10*3/uL (ref 4.0–10.5)

## 2023-07-19 LAB — COMPREHENSIVE METABOLIC PANEL
ALT: 32 U/L (ref 0–35)
AST: 22 U/L (ref 0–37)
Albumin: 4.5 g/dL (ref 3.5–5.2)
Alkaline Phosphatase: 85 U/L (ref 39–117)
BUN: 14 mg/dL (ref 6–23)
CO2: 28 meq/L (ref 19–32)
Calcium: 9.5 mg/dL (ref 8.4–10.5)
Chloride: 103 meq/L (ref 96–112)
Creatinine, Ser: 0.71 mg/dL (ref 0.40–1.20)
GFR: 101.87 mL/min (ref >60.00)
Glucose, Bld: 105 mg/dL — ABNORMAL HIGH (ref 70–99)
Potassium: 4 meq/L (ref 3.5–5.1)
Sodium: 139 meq/L (ref 135–145)
Total Bilirubin: 0.9 mg/dL (ref 0.2–1.2)
Total Protein: 7.6 g/dL (ref 6.0–8.3)

## 2023-07-19 LAB — TSH: TSH: 1.96 u[IU]/mL (ref 0.35–5.50)

## 2023-07-19 LAB — IBC + FERRITIN
Ferritin: 71.5 ng/mL (ref 10.0–291.0)
Iron: 87 ug/dL (ref 42–145)
Saturation Ratios: 25 % (ref 20.0–50.0)
TIBC: 348.6 ug/dL (ref 250.0–450.0)
Transferrin: 249 mg/dL (ref 212.0–360.0)

## 2023-07-19 LAB — VITAMIN B12: Vitamin B-12: 642 pg/mL (ref 211–911)

## 2023-07-19 LAB — HEMOGLOBIN A1C: Hgb A1c MFr Bld: 6 % (ref 4.6–6.5)

## 2023-07-19 MED ORDER — ESCITALOPRAM OXALATE 10 MG PO TABS: 10.0000 mg | ORAL_TABLET | Freq: Every day | ORAL | 0 refills | Status: DC

## 2023-07-19 NOTE — Assessment & Plan Note (Signed)
 Start lexapro Referral placed for therapy  Hydroxyxine as needed

## 2023-07-19 NOTE — Patient Instructions (Addendum)
  [facility] Phone # [phone] They have locations in [place], [place], [place], and [place]. They do take [employer] I know for sure and the [employer].   [facility] [phone]  [address] [place], [place], [place] [address]    [facility] [employer] [address] [place], [place] [address]   [phone]   [facility]- [phone]   [facility] [phone]   [facility]- [phone]   ------------------------------------  Start lexapro 10 mg for anxiety and depression. Take 1/2 tablet by mouth once daily for about one week, then increase to 1 full tablet thereafter.   Taking the medicine as directed and not missing any doses is one of the best things you can do to treat your anxiety/depression.  Here are some things to keep in mind:  Side effects (stomach upset, some increased anxiety) may happen before you notice a benefit.  These side effects typically go away over time. Changes to your dose of medicine or a change in medication all together is sometimes necessary Many people will notice an improvement within two weeks but the full effect of the medication can take up to 4-6 weeks Stopping the medication when you start feeling better often results in a return of symptoms. Most people need to be on medication at least 6-12 months If you start having thoughts of hurting yourself or others after starting this medicine, please call me immediately.     ------------------------------------   Start monitoring your blood pressure daily, around the same time of day, for the next 2-3 weeks.  Ensure that you have rested for 30 minutes prior to checking your blood pressure. Record your readings and bring them to your next visit.  ------------------------------------  A referral was placed today for therapy  Please let us know if you have not heard back within 2 weeks about the referral.

## 2023-07-19 NOTE — Assessment & Plan Note (Signed)
 Suspect anxiety  Physical exam ok

## 2023-07-19 NOTE — Assessment & Plan Note (Signed)
 Pt advised of the following: Work on a diabetic diet, try to incorporate exercise at least 20-30 a day for 3 days a week or more.  Ordering A1c

## 2023-07-19 NOTE — Progress Notes (Signed)
 Established Patient Office Visit  Subjective:   Patient ID: Brianna Schneider, female    DOB: 1976-03-22  Age: 48 y.o. MRN: [id number]  CC:  Chief Complaint  Patient presents with   Acute Visit    Reports light headedness for the past week.     HPI: Brianna Schneider is a 48 y.o. female presenting on 07/19/2023 for Acute Visit (Reports light headedness for the past week. )  Stress, increased. States a lot has been going on that is increasing her anxiety. She states she is feeling periods of feeling lightheaded and will feel her blood pressure increasing when she feels this as well.  She does use hydroxyxine which she is now having to use three times daily. Denies headaches. She does state that she associates this along with  with her anxiety. She does notice this is starting to affect her throughout the day. It is hard for her to fall asleep at night because she gets increased anxiety. She is working two jobs so she works seven days a week which is a lot of pressure.   No fatigue.  No cp no palpitations or sob.   Today blood pressure 144/92.  Last visit 09/29/22 118/70  03/18/22 130/84        ROS: Negative unless specifically indicated above in HPI.   Relevant past medical history reviewed and updated as indicated.   Allergies and medications reviewed and updated.   Current Outpatient Medications:    escitalopram (LEXAPRO) 10 MG tablet, Take 1 tablet (10 mg total) by mouth daily., Disp: 90 tablet, Rfl: 0   hydrOXYzine (ATARAX) 10 MG tablet, Take 1 tablet by mouth three times daily as needed, Disp: 30 tablet, Rfl: 0  No Known Allergies  Objective:   BP (!) 142/90   Pulse 80   Temp 97.7 F (36.5 C) (Temporal)   Ht 5\' 2"  (1.575 m)   Wt 276 lb 6.4 oz (125.4 kg)   SpO2 98%   BMI 50.55 kg/m    Physical Exam Constitutional:      General: She is not in acute distress.    Appearance: Normal appearance. She is obese. She is not ill-appearing, toxic-appearing  or diaphoretic.  HENT:     Head: Normocephalic.  Cardiovascular:     Rate and Rhythm: Normal rate and regular rhythm.  Pulmonary:     Effort: Pulmonary effort is normal.  Musculoskeletal:        General: Normal range of motion.  Neurological:     General: No focal deficit present.     Mental Status: She is alert and oriented to person, place, and time. Mental status is at baseline.  Psychiatric:        Mood and Affect: Mood normal.        Behavior: Behavior normal.        Thought Content: Thought content normal.        Judgment: Judgment normal.     Assessment & Plan:  GAD (generalized anxiety disorder) Assessment & Plan: Start lexapro Referral placed for therapy  Hydroxyxine as needed  Orders: -     Escitalopram Oxalate; Take 1 tablet (10 mg total) by mouth daily.  Dispense: 90 tablet; Refill: 0 -     Ambulatory referral to Psychology  Elevated BP without diagnosis of hypertension Assessment & Plan: Pt advised of the following:  Continue medication as prescribed. Monitor blood pressure periodically and/or when you feel symptomatic. Goal is <130/90 on average. Ensure that you have  rested for 30 minutes prior to checking your blood pressure. Record your readings and bring them to your next visit if necessary.work on a low sodium diet.   Orders: -     TSH  Prediabetes Assessment & Plan: Pt advised of the following: Work on a diabetic diet, try to incorporate exercise at least 20-30 a day for 3 days a week or more.  Ordering A1c   Orders: -     Comprehensive metabolic panel -     Hemoglobin A1c  Morbid obesity (HCC)  Elevated LFTs -     Comprehensive metabolic panel  Dizziness Assessment & Plan: Suspect anxiety  Physical exam ok    Orders: -     CBC -     IBC + Ferritin; Future -     TSH -     Vitamin B12     Follow up plan: Return in about 3 weeks (around 08/09/2023) for f/u anxiety.  Mort Sawyers, FNP

## 2023-07-19 NOTE — Assessment & Plan Note (Signed)
 Pt advised of the following:  Continue medication as prescribed. Monitor blood pressure periodically and/or when you feel symptomatic. Goal is <130/90 on average. Ensure that you have rested for 30 minutes prior to checking your blood pressure. Record your readings and bring them to your next visit if necessary.work on a low sodium diet.

## 2023-07-20 ENCOUNTER — Encounter: Payer: Self-pay | Admitting: Family

## 2023-07-28 ENCOUNTER — Telehealth: Admitting: Physician Assistant

## 2023-07-28 DIAGNOSIS — F411 Generalized anxiety disorder: Secondary | ICD-10-CM

## 2023-07-28 DIAGNOSIS — I10 Essential (primary) hypertension: Secondary | ICD-10-CM | POA: Diagnosis not present

## 2023-07-28 DIAGNOSIS — G479 Sleep disorder, unspecified: Secondary | ICD-10-CM | POA: Diagnosis not present

## 2023-07-28 MED ORDER — LOSARTAN POTASSIUM 50 MG PO TABS: 50.0000 mg | ORAL_TABLET | Freq: Every day | ORAL | 0 refills | Status: DC

## 2023-07-28 NOTE — Telephone Encounter (Signed)
 Copied from CRM [id number]. Topic: Clinical - Medication Question >> Jul 28, 2023  8:27 AM Almira Coaster wrote: Reason for CRM: Patient is calling to speak with [facility] regarding some concerns she has with her escitalopram (LEXAPRO) 10 MG tablet medication. Per patient, she has written MyChart messages and has not gotten any response yet. Patient's best call back number is [phone]

## 2023-07-28 NOTE — Telephone Encounter (Signed)
 We can wait for Brianna Schneider to respond or can schedule an office visit with me

## 2023-07-28 NOTE — Telephone Encounter (Deleted)
 Copied from CRM [id number]. Topic: Clinical - Medication Question >> Jul 28, 2023  8:27 AM Almira Coaster wrote: Reason for CRM: Patient is calling to speak with [facility] regarding some concerns she has with her escitalopram (LEXAPRO) 10 MG tablet medication. Per patient, she has written MyChart messages and has not gotten any response yet. Patient's best call back number is [phone]

## 2023-07-28 NOTE — Patient Instructions (Signed)
  [name], thank you for joining Margaretann Loveless, PA-C for today's virtual visit.  While this provider is not your primary care provider (PCP), if your PCP is located in our provider database this encounter information will be shared with them immediately following your visit.   A [facility] MyChart account gives you access to today's visit and all your visits, tests, and labs performed at [facility] " click here if you don't have a [facility] MyChart account or go to [email or web address]  Consent: (Patient) Brianna Schneider provided verbal consent for this virtual visit at the beginning of the encounter.  Current Medications:  Current Outpatient Medications:    losartan (COZAAR) 50 MG tablet, Take 1 tablet (50 mg total) by mouth daily., Disp: 90 tablet, Rfl: 0   escitalopram (LEXAPRO) 10 MG tablet, Take 1 tablet (10 mg total) by mouth daily., Disp: 90 tablet, Rfl: 0   hydrOXYzine (ATARAX) 10 MG tablet, Take 1 tablet by mouth three times daily as needed, Disp: 30 tablet, Rfl: 0   Medications ordered in this encounter:  Meds ordered this encounter  Medications   losartan (COZAAR) 50 MG tablet    Sig: Take 1 tablet (50 mg total) by mouth daily.    Dispense:  90 tablet    Refill:  0    Supervising Provider:   Merrilee Jansky [[id number]]     *If you need refills on other medications prior to your next appointment, please contact your pharmacy*  Follow-Up: Call back or seek an in-person evaluation if the symptoms worsen or if the condition fails to improve as anticipated.  [facility] [phone]  Other Instructions  - Patient willing to try Losartan 50mg  after discussion on dosages and how they are determined - Start with 0.5 tablet (25mg ) Losartan x 1 week then increase to one tablet (50mg ) - Did advise it can take up to a week for the blood pressure medication to get to a full effectiveness - May continue to NOT take  Escitalopram since it caused potential side effect of worsening anxiety - Advised that she can wait and see how her symptoms are over the next week or two with having her blood pressure be more controlled - Continue Hydroxyzine as needed up to three times daily - Did advise she could save one dose of her Hydroxyzine and take at bedtime for sleep - Also advised if she desired she could try Melatonin 3-5mg  at bedtime - She could try Unisom instead of the Hydroxyzine as well, not both together - Discussed sleep routine/hygiene and its importance - Discussed sleep meditation techniques - Follow up with PCP in 2-4 weeks for follow up BP, sleep, and anxiety   If you have been instructed to have an in-person evaluation today at a local Urgent Care facility, please use the link below. It will take you to a list of all of our available [facility], including address, phone number and hours of operation. Please do not delay care.  [facility]  If you or a family member do not have a primary care provider, use the link below to schedule a visit and establish care. When you choose a [facility] primary care physician or advanced practice provider, you gain a long-term partner in health. Find a Primary Care Provider  Learn more about [facility] in-office and virtual care options:  - Get Care Now

## 2023-07-28 NOTE — Telephone Encounter (Signed)
 Patient was seen last for GAD by Tabitha. You are listed as pcp. Do you want [place] to reach out and have her set up appointment.

## 2023-07-28 NOTE — Progress Notes (Signed)
 Virtual Visit Consent   Brianna Schneider, you are scheduled for a virtual visit with a [facility] provider today. Just as with appointments in the office, your consent must be obtained to participate. Your consent will be active for this visit and any virtual visit you may have with one of our providers in the next 365 days. If you have a MyChart account, a copy of this consent can be sent to you electronically.  As this is a virtual visit, video technology does not allow for your provider to perform a traditional examination. This may limit your provider's ability to fully assess your condition. If your provider identifies any concerns that need to be evaluated in person or the need to arrange testing (Schneider as labs, EKG, etc.), we will make arrangements to do so. Although advances in technology are sophisticated, we cannot ensure that it will always work on either your end or our end. If the connection with a video visit is poor, the visit may have to be switched to a telephone visit. With either a video or telephone visit, we are not always able to ensure that we have a secure connection.  By engaging in this virtual visit, you consent to the provision of healthcare and authorize for your insurance to be billed (if applicable) for the services provided during this visit. Depending on your insurance coverage, you may receive a charge related to this service.  I need to obtain your verbal consent now. Are you willing to proceed with your visit today? Brianna Schneider has provided verbal consent on 07/28/2023 for a virtual visit (video or telephone). Margaretann Loveless, PA-C  Date: 07/28/2023 12:44 PM   Virtual Visit via Video Note   I, Margaretann Loveless, connected with  Brianna Schneider  ([id number], 47-14-1977) on 07/28/23 at  9:30 AM EDT by a video-enabled telemedicine application and verified that I am speaking with the correct person using two identifiers.  Location: Patient:  Virtual Visit Location Patient: Home Provider: Virtual Visit Location Provider: Home Office   I discussed the limitations of evaluation and management by telemedicine and the availability of in person appointments. The patient expressed understanding and agreed to proceed.    History of Present Illness: Brianna Schneider is a 48 y.o. who identifies as a female who was assigned female at birth, and is being seen today for blood pressure, anxiety, and sleep issues.  BP still running around 140s/90s consistently on at home checks. Had been 142/90 at PCP office on 07/19/23. Her PCP office had wanted to start Losartan 50mg , but patient had been hesitant to start because of it being a "high dose".   Anxiety has been increasing. Has a lot of external stressors. Working 2 jobs without days off to relax and decompress. Is using Hydroxyzine 10mg  up to 3 times daily as needed for acute anxiety. She was started on Escitalopram 10mg  on 07/19/23. She has been taking 0.5 tablet (5mg ). Reports feeling increased anxiety since starting it. Stopped it 2 days ago and anxiety has decreased from what it was while taking the Escitalopram. She has not tried any other SSRI, SNRI medications.   She also mentions a decrease in sleep quality. Having trouble falling asleep and staying asleep. Having anxious feelings and worrying thoughts. Causing increased daytime fatigue and drowsiness.   Problems:  Patient Active Problem List   Diagnosis Date Noted   Prediabetes 09/29/2022   Situational anxiety 09/29/2022   Dizziness 03/18/2022   Amenorrhea 02/28/2022  BPV (benign positional vertigo) 02/26/2022   Morbid obesity (HCC) 07/22/2021   Elevated BP without diagnosis of hypertension 07/22/2021   Esophageal reflux    GAD (generalized anxiety disorder) 04/24/2015    Allergies: No Known Allergies Medications:  Current Outpatient Medications:    losartan (COZAAR) 50 MG tablet, Take 1 tablet (50 mg total) by mouth  daily., Disp: 90 tablet, Rfl: 0   escitalopram (LEXAPRO) 10 MG tablet, Take 1 tablet (10 mg total) by mouth daily., Disp: 90 tablet, Rfl: 0   hydrOXYzine (ATARAX) 10 MG tablet, Take 1 tablet by mouth three times daily as needed, Disp: 30 tablet, Rfl: 0  Observations/Objective: Patient is well-developed, well-nourished in no acute distress.  Resting comfortably at home.  Head is normocephalic, atraumatic.  No labored breathing.  Speech is clear and coherent with logical content.  Patient is alert and oriented at baseline.    Assessment and Plan: 1. Primary hypertension (Primary) - losartan (COZAAR) 50 MG tablet; Take 1 tablet (50 mg total) by mouth daily.  Dispense: 90 tablet; Refill: 0  2. GAD (generalized anxiety disorder)  3. Difficulty sleeping  - Patient willing to try Losartan 50mg  after discussion on dosages and how they are determined - Start with 0.5 tablet (25mg ) Losartan x 1 week then increase to one tablet (50mg ) - Did advise it can take up to a week for the blood pressure medication to get to a full effectiveness - May continue to NOT take Escitalopram since it caused potential side effect of worsening anxiety - Advised that she can wait and see how her symptoms are over the next week or two with having her blood pressure be more controlled - Continue Hydroxyzine as needed up to three times daily - Did advise she could save one dose of her Hydroxyzine and take at bedtime for sleep - Also advised if she desired she could try Melatonin 3-5mg  at bedtime - She could try Unisom instead of the Hydroxyzine as well, not both together - Discussed sleep routine/hygiene and its importance - Discussed sleep meditation techniques - Follow up with PCP in 2-4 weeks for follow up BP, sleep, and anxiety  Follow Up Instructions: I discussed the assessment and treatment plan with the patient. The patient was provided an opportunity to ask questions and all were answered. The patient  agreed with the plan and demonstrated an understanding of the instructions.  A copy of instructions were sent to the patient via MyChart unless otherwise noted below.    The patient was advised to call back or seek an in-person evaluation if the symptoms worsen or if the condition fails to improve as anticipated.    Margaretann Loveless, PA-C

## 2023-08-03 ENCOUNTER — Other Ambulatory Visit: Payer: Self-pay | Admitting: Nurse Practitioner

## 2023-08-03 ENCOUNTER — Ambulatory Visit
Admission: RE | Admit: 2023-08-03 | Discharge: 2023-08-03 | Disposition: A | Discharge status: Home / Self Care | Payer: 59 | Source: Ambulatory Visit | Attending: Nurse Practitioner | Admitting: Nurse Practitioner

## 2023-08-03 DIAGNOSIS — F418 Other specified anxiety disorders: Secondary | ICD-10-CM

## 2023-08-03 DIAGNOSIS — N6315 Unspecified lump in the right breast, overlapping quadrants: Secondary | ICD-10-CM | POA: Diagnosis not present

## 2023-08-03 DIAGNOSIS — N6310 Unspecified lump in the right breast, unspecified quadrant: Secondary | ICD-10-CM

## 2023-08-04 ENCOUNTER — Encounter: Payer: Self-pay | Admitting: Nurse Practitioner

## 2023-09-27 ENCOUNTER — Other Ambulatory Visit: Payer: Self-pay | Admitting: Nurse Practitioner

## 2023-09-27 DIAGNOSIS — F418 Other specified anxiety disorders: Secondary | ICD-10-CM

## 2023-11-01 ENCOUNTER — Other Ambulatory Visit: Payer: Self-pay | Admitting: Nurse Practitioner

## 2023-11-01 DIAGNOSIS — F418 Other specified anxiety disorders: Secondary | ICD-10-CM

## 2023-11-28 ENCOUNTER — Other Ambulatory Visit: Payer: Self-pay | Admitting: Nurse Practitioner

## 2023-11-28 DIAGNOSIS — F418 Other specified anxiety disorders: Secondary | ICD-10-CM

## 2023-12-30 ENCOUNTER — Telehealth: Payer: Self-pay | Admitting: Nurse Practitioner

## 2023-12-30 ENCOUNTER — Other Ambulatory Visit: Payer: Self-pay | Admitting: Nurse Practitioner

## 2023-12-30 DIAGNOSIS — F418 Other specified anxiety disorders: Secondary | ICD-10-CM

## 2023-12-30 DIAGNOSIS — I10 Essential (primary) hypertension: Secondary | ICD-10-CM

## 2023-12-30 NOTE — Telephone Encounter (Unsigned)
 Copied from CRM [id number]. Topic: Clinical - Medication Refill >> Dec 30, 2023  4:05 PM Tysheama G wrote: Medication: losartan  (COZAAR ) 50 MG tablet and hydrOXYzine  (ATARAX ) 10 MG tablet  Has the patient contacted their pharmacy? Yes (Agent: If no, request that the patient contact the pharmacy for the refill. If patient does not wish to contact the pharmacy document the reason why and proceed with request.) (Agent: If yes, when and what did the pharmacy advise?)  This is the patient's preferred pharmacy:  [id number] - [place] - [address] [place] ([place]) [place] [address] Phone: [phone] Fax: [phone]  Is this the correct pharmacy for this prescription? Yes If no, delete pharmacy and type the correct one.   Has the prescription been filled recently? No  Is the patient out of the medication? Yes  Has the patient been seen for an appointment in the last year OR does the patient have an upcoming appointment? Yes  Can we respond through MyChart? Yes  Agent: Please be advised that Rx refills may take up to 3 business days. We ask that you follow-up with your pharmacy.

## 2023-12-30 NOTE — Telephone Encounter (Signed)
 Copied from CRM [id number]. Topic: Clinical - Medication Refill >> Dec 30, 2023  4:05 PM Tysheama G wrote: Medication: losartan  (COZAAR ) 50 MG tablet and hydrOXYzine  (ATARAX ) 10 MG tablet  Has the patient contacted their pharmacy? Yes (Agent: If no, request that the patient contact the pharmacy for the refill. If patient does not wish to contact the pharmacy document the reason why and proceed with request.) (Agent: If yes, when and what did the pharmacy advise?)  This is the patient's preferred pharmacy:  [id number] - [place] - [address] [place] ([place]) [place] [address] Phone: [phone] Fax: [phone]  Is this the correct pharmacy for this prescription? Yes If no, delete pharmacy and type the correct one.   Has the prescription been filled recently? No  Is the patient out of the medication? Yes  Has the patient been seen for an appointment in the last year OR does the patient have an upcoming appointment? Yes  Can we respond through MyChart? Yes  Agent: Please be advised that Rx refills may take up to 3 business days. We ask that you follow-up with your pharmacy.

## 2024-01-02 MED ORDER — LOSARTAN POTASSIUM 50 MG PO TABS: 50.0000 mg | ORAL_TABLET | Freq: Every day | ORAL | 0 refills | Status: DC

## 2024-01-02 MED ORDER — HYDROXYZINE HCL 10 MG PO TABS: 10.0000 mg | ORAL_TABLET | Freq: Three times a day (TID) | ORAL | 0 refills | Status: DC | PRN

## 2024-02-06 ENCOUNTER — Other Ambulatory Visit: Payer: Self-pay | Admitting: Nurse Practitioner

## 2024-02-06 DIAGNOSIS — F418 Other specified anxiety disorders: Secondary | ICD-10-CM

## 2024-02-06 DIAGNOSIS — I10 Essential (primary) hypertension: Secondary | ICD-10-CM

## 2024-02-06 NOTE — Telephone Encounter (Unsigned)
 Copied from CRM [id number]. Topic: Clinical - Medication Refill >> Feb 06, 2024  3:35 PM [place] E wrote: Medication: hydrOXYzine  (ATARAX ) 10 MG table losartan  (COZAAR ) 50 MG tablet  Has the patient contacted their pharmacy? Yes (Agent: If no, request that the patient contact the pharmacy for the refill. If patient does not wish to contact the pharmacy document the reason why and proceed with request.) (Agent: If yes, when and what did the pharmacy advise?)  This is the patient's preferred pharmacy:  [id number] - [place] - [address] [place] ([place]) [place] [address] Phone: [phone] Fax: [phone]  Is this the correct pharmacy for this prescription? Yes If no, delete pharmacy and type the correct one.   Has the prescription been filled recently? Yes  Is the patient out of the medication? Yes  Has the patient been seen for an appointment in the last year OR does the patient have an upcoming appointment? Yes  Can we respond through MyChart? Yes  Agent: Please be advised that Rx refills may take up to 3 business days. We ask that you follow-up with your pharmacy.

## 2024-02-07 MED ORDER — LOSARTAN POTASSIUM 50 MG PO TABS
50.0000 mg | ORAL_TABLET | Freq: Every day | ORAL | 0 refills | Status: DC
Start: 2024-02-07 — End: 2024-03-15

## 2024-02-07 MED ORDER — HYDROXYZINE HCL 10 MG PO TABS: 10.0000 mg | ORAL_TABLET | Freq: Three times a day (TID) | ORAL | 0 refills | Status: DC | PRN

## 2024-02-07 NOTE — Telephone Encounter (Signed)
 Over due for a CPE. Needs to be scheduled in the next 30 days to continue getting refills. Any open slot is ok

## 2024-02-07 NOTE — Telephone Encounter (Signed)
 I spoked with patient and she would give a call back of whenever she would like to make her appt.

## 2024-02-28 NOTE — Telephone Encounter (Signed)
Called pt left vm to call office back

## 2024-03-15 ENCOUNTER — Ambulatory Visit (INDEPENDENT_AMBULATORY_CARE_PROVIDER_SITE_OTHER): Admitting: Nurse Practitioner

## 2024-03-15 ENCOUNTER — Encounter: Payer: Self-pay | Admitting: Nurse Practitioner

## 2024-03-15 VITALS — BP 132/86 | HR 67 | Temp 97.9°F | Ht 62.5 in | Wt 278.8 lb

## 2024-03-15 DIAGNOSIS — Z0000 Encounter for general adult medical examination without abnormal findings: Secondary | ICD-10-CM

## 2024-03-15 DIAGNOSIS — I10 Essential (primary) hypertension: Secondary | ICD-10-CM | POA: Diagnosis not present

## 2024-03-15 DIAGNOSIS — Z1211 Encounter for screening for malignant neoplasm of colon: Secondary | ICD-10-CM

## 2024-03-15 DIAGNOSIS — R7303 Prediabetes: Secondary | ICD-10-CM

## 2024-03-15 DIAGNOSIS — F418 Other specified anxiety disorders: Secondary | ICD-10-CM

## 2024-03-15 DIAGNOSIS — F411 Generalized anxiety disorder: Secondary | ICD-10-CM

## 2024-03-15 LAB — LIPID PANEL
Cholesterol: 222 mg/dL — ABNORMAL HIGH (ref 0–200)
HDL: 55.7 mg/dL (ref >39.00)
LDL Cholesterol: 147 mg/dL — ABNORMAL HIGH (ref 0–99)
NonHDL: 166.2
Total CHOL/HDL Ratio: 4
Triglycerides: 95 mg/dL (ref 0.0–149.0)
VLDL: 19 mg/dL (ref 0.0–40.0)

## 2024-03-15 LAB — CBC WITH DIFFERENTIAL/PLATELET
Basophils Absolute: 0 K/uL (ref 0.0–0.1)
Basophils Relative: 0.2 % (ref 0.0–3.0)
Eosinophils Absolute: 0.3 K/uL (ref 0.0–0.7)
Eosinophils Relative: 3.7 % (ref 0.0–5.0)
HCT: 38.8 % (ref 36.0–46.0)
Hemoglobin: 12.9 g/dL (ref 12.0–15.0)
Lymphocytes Relative: 38.8 % (ref 12.0–46.0)
Lymphs Abs: 2.8 K/uL (ref 0.7–4.0)
MCHC: 33.2 g/dL (ref 30.0–36.0)
MCV: 88.8 fl (ref 78.0–100.0)
Monocytes Absolute: 0.5 K/uL (ref 0.1–1.0)
Monocytes Relative: 6.4 % (ref 3.0–12.0)
Neutro Abs: 3.6 K/uL (ref 1.4–7.7)
Neutrophils Relative %: 50.9 % (ref 43.0–77.0)
Platelets: 250 K/uL (ref 150.0–400.0)
RBC: 4.37 Mil/uL (ref 3.87–5.11)
RDW: 14.2 % (ref 11.5–15.5)
WBC: 7.1 K/uL (ref 4.0–10.5)

## 2024-03-15 LAB — HEMOGLOBIN A1C: Hgb A1c MFr Bld: 6 % (ref 4.6–6.5)

## 2024-03-15 LAB — COMPREHENSIVE METABOLIC PANEL WITH GFR
ALT: 31 U/L (ref 0–35)
AST: 25 U/L (ref 0–37)
Albumin: 4.4 g/dL (ref 3.5–5.2)
Alkaline Phosphatase: 71 U/L (ref 39–117)
BUN: 13 mg/dL (ref 6–23)
CO2: 29 meq/L (ref 19–32)
Calcium: 9.6 mg/dL (ref 8.4–10.5)
Chloride: 101 meq/L (ref 96–112)
Creatinine, Ser: 0.69 mg/dL (ref 0.40–1.20)
GFR: 103.5 mL/min (ref >60.00)
Glucose, Bld: 90 mg/dL (ref 70–99)
Potassium: 4.4 meq/L (ref 3.5–5.1)
Sodium: 138 meq/L (ref 135–145)
Total Bilirubin: 0.8 mg/dL (ref 0.2–1.2)
Total Protein: 7.6 g/dL (ref 6.0–8.3)

## 2024-03-15 LAB — TSH: TSH: 2.16 u[IU]/mL (ref 0.35–5.50)

## 2024-03-15 MED ORDER — SERTRALINE HCL 50 MG PO TABS: ORAL_TABLET | ORAL | 0 refills | Status: DC

## 2024-03-15 MED ORDER — LOSARTAN POTASSIUM 50 MG PO TABS: 50.0000 mg | ORAL_TABLET | Freq: Every day | ORAL | 2 refills | Status: AC

## 2024-03-15 MED ORDER — ZEPBOUND 2.5 MG/0.5ML ~~LOC~~ SOAJ: 2.5000 mg | SUBCUTANEOUS | 0 refills | Status: AC

## 2024-03-15 MED ORDER — HYDROXYZINE HCL 10 MG PO TABS: 10.0000 mg | ORAL_TABLET | Freq: Three times a day (TID) | ORAL | 0 refills | Status: AC | PRN

## 2024-03-15 NOTE — Progress Notes (Signed)
 Established Patient Office Visit  Subjective   Patient ID: Brianna Schneider, female    DOB: [birth year]  Age: 48 y.o. MRN: [id number]  Chief Complaint  Patient presents with   Annual Exam    HPI   Mood: Patient currently maintained on hydroxyzine  10 mg 3 times daily as needed. States that she feels okay. She is taking it once day. She does have toruble staying asleep sometimes.   HTN: Currently maintained on losartan  50 mg daily.  Has a cuff at home  for complete physical and follow up of chronic conditions.  Immunizations: -Tetanus: Completed in 2009 -Influenza refused -Shingles: Too young -Pneumonia: Too young - COVID: Original series  Diet: Fair diet. She is eating 2-3 meals and snacks. She does drink coffee water an dcutting back on the sodas  Exercise: No regular exercise. She will walk sometimes. Once to twice a week. She does work   Eye exam: Completes annually. Glasses every other year   Dental exam: Needs updating  Colonoscopy: Due, Cologuard ordered Lung Cancer Screening: N/A  Pap smear: Followed by GYN.  Patient is overdue encouraged her to call her GYN  Mammogram: 08/03/2023 repeat 1 year  DEXA: Too young      Review of Systems  Constitutional:  Negative for chills and fever.  Respiratory:  Negative for shortness of breath.   Cardiovascular:  Negative for chest pain and leg swelling.  Gastrointestinal:  Negative for abdominal pain, blood in stool, constipation, diarrhea, nausea and vomiting.       BM daily   Genitourinary:  Negative for dysuria and hematuria.  Neurological:  Positive for dizziness. Negative for tingling and headaches.  Psychiatric/Behavioral:  Negative for hallucinations and suicidal ideas.       Objective:     BP 132/86   Pulse 67   Temp 97.9 F (36.6 C) (Oral)   Ht 5' 2.5 (1.588 m)   Wt 278 lb 12.8 oz (126.5 kg)   SpO2 99%   BMI 50.18 kg/m  BP Readings from Last 3 Encounters:  03/15/24 132/86  07/19/23 (!)  142/90  09/29/22 118/70   Wt Readings from Last 3 Encounters:  03/15/24 278 lb 12.8 oz (126.5 kg)  07/19/23 276 lb 6.4 oz (125.4 kg)  09/29/22 267 lb (121.1 kg)   SpO2 Readings from Last 3 Encounters:  03/15/24 99%  07/19/23 98%  09/29/22 99%      Physical Exam Vitals and nursing note reviewed.  Constitutional:      Appearance: Normal appearance. She is obese.  HENT:     Right Ear: Tympanic membrane, ear canal and external ear normal.     Left Ear: Tympanic membrane, ear canal and external ear normal.     Mouth/Throat:     Mouth: Mucous membranes are moist.     Pharynx: Oropharynx is clear.  Eyes:     Extraocular Movements: Extraocular movements intact.     Pupils: Pupils are equal, round, and reactive to light.  Cardiovascular:     Rate and Rhythm: Normal rate and regular rhythm.     Pulses: Normal pulses.     Heart sounds: Normal heart sounds.  Pulmonary:     Effort: Pulmonary effort is normal.     Breath sounds: Normal breath sounds.  Abdominal:     General: Bowel sounds are normal. There is no distension.     Palpations: There is no mass.     Tenderness: There is no abdominal tenderness.     Hernia:  No hernia is present.  Musculoskeletal:     Right lower leg: No edema.     Left lower leg: No edema.  Lymphadenopathy:     Cervical: No cervical adenopathy.  Skin:    General: Skin is warm.  Neurological:     General: No focal deficit present.     Mental Status: She is alert.     Deep Tendon Reflexes:     Reflex Scores:      Bicep reflexes are 2+ on the right side and 2+ on the left side.      Patellar reflexes are 2+ on the right side and 2+ on the left side.    Comments: Bilateral upper and lower extremity strength 5/5  Psychiatric:        Mood and Affect: Mood is anxious.        Behavior: Behavior normal.        Thought Content: Thought content normal.        Judgment: Judgment normal.      No results found for any visits on 03/15/24.    The  10-year ASCVD risk score (Arnett DK, et al., 2019) is: 4.3%    Assessment & Plan:   Problem List Items Addressed This Visit       Cardiovascular and Mediastinum   Primary hypertension   Patient currently maintained on losartan  50 mg daily.  Blood pressure controlled.  Patient can check blood pressure at home if needed.  Continue medication as prescribed.  Refill provided today      Relevant Medications   losartan  (COZAAR ) 50 MG tablet   Other Relevant Orders   CBC with Differential/Platelet   Comprehensive metabolic panel with GFR   Hemoglobin A1c   TSH   Lipid panel     Other   GAD (generalized anxiety disorder)   Patient was on hydroxyzine  10 mg 3 times daily as needed she is using it daily.  Has tried citalopram  and Lexapro  in the past and failed.  Will start sertraline 25 mg daily for 10 days then increase to 50 mg daily thereafter.  Patient denies HI/SI/AVH.      Relevant Medications   sertraline (ZOLOFT) 50 MG tablet   hydrOXYzine  (ATARAX ) 10 MG tablet   Other Relevant Orders   TSH   Morbid obesity (HCC)   Pending TSH, A1c, lipid panel.  Did encourage healthy lifestyle modifications inclusive of exercise.  We did discuss possible weight loss injectable medication such as Wegovy and Zepbound.  Patient will check with her insurance to see if these medications are covered      Relevant Orders   Hemoglobin A1c   Lipid panel   Preventative health care - Primary   Discussed age-appropriate immunizations and screening exams.  Did review patient's personal, surgical, social, family histories.  Patient is up-to-date on all age-appropriate vaccinations she would like.  Patient declined flu vaccine today.  Patient also declined tetanus vaccine today.  Cologuard ordered for CRC screening.  Patient is up-to-date on breast cancer screening.  Encourage patient to follow-up with GYN for cervical cancer screening.  Patient was given information at discharge about preventative healthcare  maintenance with anticipatory guidance.      Relevant Orders   CBC with Differential/Platelet   Comprehensive metabolic panel with GFR   TSH   Prediabetes   History of the same.  Pending A1c      Relevant Orders   Hemoglobin A1c   Lipid panel   Situational anxiety  Patient using hydroxyzine  daily as needed.  Not having adequate control will start sertraline 25 mg daily for 10 days then increase to 50 mg daily thereafter.      Relevant Medications   sertraline (ZOLOFT) 50 MG tablet   hydrOXYzine  (ATARAX ) 10 MG tablet   Other Visit Diagnoses       Screening for colon cancer       Relevant Orders   Cologuard       Return in about 6 weeks (around 04/26/2024) for GAD.    Adina Crandall, NP

## 2024-03-15 NOTE — Assessment & Plan Note (Signed)
 Patient using hydroxyzine  daily as needed.  Not having adequate control will start sertraline 25 mg daily for 10 days then increase to 50 mg daily thereafter.

## 2024-03-15 NOTE — Assessment & Plan Note (Signed)
 Discussed age-appropriate immunizations and screening exams.  Did review patient's personal, surgical, social, family histories.  Patient is up-to-date on all age-appropriate vaccinations she would like.  Patient declined flu vaccine today.  Patient also declined tetanus vaccine today.  Cologuard ordered for CRC screening.  Patient is up-to-date on breast cancer screening.  Encourage patient to follow-up with GYN for cervical cancer screening.  Patient was given information at discharge about preventative healthcare maintenance with anticipatory guidance.

## 2024-03-15 NOTE — Assessment & Plan Note (Signed)
History of the same.  Pending A1c

## 2024-03-15 NOTE — Assessment & Plan Note (Signed)
 Patient currently maintained on losartan  50 mg daily.  Blood pressure controlled.  Patient can check blood pressure at home if needed.  Continue medication as prescribed.  Refill provided today

## 2024-03-15 NOTE — Assessment & Plan Note (Signed)
 Pending TSH, A1c, lipid panel.  Did encourage healthy lifestyle modifications inclusive of exercise.  We did discuss possible weight loss injectable medication such as Wegovy and Zepbound.  Patient will check with her insurance to see if these medications are covered

## 2024-03-15 NOTE — Assessment & Plan Note (Signed)
 Patient was on hydroxyzine  10 mg 3 times daily as needed she is using it daily.  Has tried citalopram  and Lexapro  in the past and failed.  Will start sertraline 25 mg daily for 10 days then increase to 50 mg daily thereafter.  Patient denies HI/SI/AVH.

## 2024-03-15 NOTE — Patient Instructions (Signed)
 Nice to see you today  I will be in touch with the labs once I have them  Call you GYN and get an updated pap smear  Wegovy and Zepbound are the weight loss injectable medications  Follow up with me in 6 weeks, sooner if you need me

## 2024-03-19 ENCOUNTER — Ambulatory Visit: Payer: Self-pay | Admitting: Nurse Practitioner

## 2024-04-09 ENCOUNTER — Other Ambulatory Visit: Payer: Self-pay | Admitting: Nurse Practitioner

## 2024-04-09 DIAGNOSIS — F411 Generalized anxiety disorder: Secondary | ICD-10-CM

## 2024-04-09 NOTE — Telephone Encounter (Signed)
 Needs office visit in 30 days to evaluate mood

## 2024-04-10 NOTE — Telephone Encounter (Signed)
 I called and left a voicemail for patient to be scheduled along with sending a message.

## 2024-04-19 ENCOUNTER — Other Ambulatory Visit: Payer: Self-pay | Admitting: Nurse Practitioner

## 2024-04-19 DIAGNOSIS — F411 Generalized anxiety disorder: Secondary | ICD-10-CM
